# Patient Record
Sex: Male | Born: 1960 | Hispanic: No | Marital: Married | Smoking: Never smoker
Health system: Southern US, Community
[De-identification: ages and names within clinical notes are randomized; demographics above are authoritative.]

## PROBLEM LIST (undated history)

## (undated) DIAGNOSIS — I639 Cerebral infarction, unspecified: Secondary | ICD-10-CM

## (undated) DIAGNOSIS — I10 Essential (primary) hypertension: Secondary | ICD-10-CM

## (undated) HISTORY — DX: Cerebral infarction, unspecified: I63.9

## (undated) HISTORY — DX: Essential (primary) hypertension: I10

---

## 2015-07-19 ENCOUNTER — Encounter (HOSPITAL_COMMUNITY): Payer: Self-pay | Admitting: Family Medicine

## 2015-07-19 ENCOUNTER — Inpatient Hospital Stay (HOSPITAL_COMMUNITY): Payer: 59

## 2015-07-19 ENCOUNTER — Emergency Department (HOSPITAL_COMMUNITY): Payer: 59

## 2015-07-19 ENCOUNTER — Inpatient Hospital Stay (HOSPITAL_COMMUNITY)
Admission: EM | Admit: 2015-07-19 | Discharge: 2015-07-22 | DRG: 065 | Disposition: A | Discharge status: Rehabilitation Facility | Payer: 59 | Attending: Neurology | Admitting: Neurology

## 2015-07-19 DIAGNOSIS — K5900 Constipation, unspecified: Secondary | ICD-10-CM | POA: Diagnosis present

## 2015-07-19 DIAGNOSIS — I10 Essential (primary) hypertension: Secondary | ICD-10-CM | POA: Diagnosis present

## 2015-07-19 DIAGNOSIS — I69398 Other sequelae of cerebral infarction: Secondary | ICD-10-CM | POA: Diagnosis not present

## 2015-07-19 DIAGNOSIS — I161 Hypertensive emergency: Secondary | ICD-10-CM | POA: Diagnosis present

## 2015-07-19 DIAGNOSIS — R531 Weakness: Secondary | ICD-10-CM | POA: Diagnosis not present

## 2015-07-19 DIAGNOSIS — N179 Acute kidney failure, unspecified: Secondary | ICD-10-CM | POA: Diagnosis present

## 2015-07-19 DIAGNOSIS — D751 Secondary polycythemia: Secondary | ICD-10-CM | POA: Diagnosis present

## 2015-07-19 DIAGNOSIS — E785 Hyperlipidemia, unspecified: Secondary | ICD-10-CM | POA: Diagnosis present

## 2015-07-19 DIAGNOSIS — I69393 Ataxia following cerebral infarction: Secondary | ICD-10-CM | POA: Diagnosis not present

## 2015-07-19 DIAGNOSIS — R221 Localized swelling, mass and lump, neck: Secondary | ICD-10-CM | POA: Diagnosis not present

## 2015-07-19 DIAGNOSIS — I6622 Occlusion and stenosis of left posterior cerebral artery: Secondary | ICD-10-CM | POA: Diagnosis present

## 2015-07-19 DIAGNOSIS — E876 Hypokalemia: Secondary | ICD-10-CM | POA: Diagnosis present

## 2015-07-19 DIAGNOSIS — R413 Other amnesia: Secondary | ICD-10-CM | POA: Diagnosis present

## 2015-07-19 DIAGNOSIS — I618 Other nontraumatic intracerebral hemorrhage: Secondary | ICD-10-CM | POA: Diagnosis not present

## 2015-07-19 DIAGNOSIS — G4733 Obstructive sleep apnea (adult) (pediatric): Secondary | ICD-10-CM | POA: Diagnosis present

## 2015-07-19 DIAGNOSIS — Z8249 Family history of ischemic heart disease and other diseases of the circulatory system: Secondary | ICD-10-CM | POA: Diagnosis not present

## 2015-07-19 DIAGNOSIS — E669 Obesity, unspecified: Secondary | ICD-10-CM | POA: Diagnosis present

## 2015-07-19 DIAGNOSIS — Z6838 Body mass index (BMI) 38.0-38.9, adult: Secondary | ICD-10-CM | POA: Diagnosis not present

## 2015-07-19 DIAGNOSIS — R29700 NIHSS score 0: Secondary | ICD-10-CM | POA: Diagnosis present

## 2015-07-19 DIAGNOSIS — I619 Nontraumatic intracerebral hemorrhage, unspecified: Secondary | ICD-10-CM | POA: Diagnosis present

## 2015-07-19 DIAGNOSIS — I6501 Occlusion and stenosis of right vertebral artery: Secondary | ICD-10-CM | POA: Diagnosis present

## 2015-07-19 DIAGNOSIS — I6789 Other cerebrovascular disease: Secondary | ICD-10-CM | POA: Diagnosis not present

## 2015-07-19 DIAGNOSIS — I610 Nontraumatic intracerebral hemorrhage in hemisphere, subcortical: Secondary | ICD-10-CM

## 2015-07-19 DIAGNOSIS — G8191 Hemiplegia, unspecified affecting right dominant side: Secondary | ICD-10-CM | POA: Diagnosis present

## 2015-07-19 DIAGNOSIS — F411 Generalized anxiety disorder: Secondary | ICD-10-CM | POA: Diagnosis not present

## 2015-07-19 DIAGNOSIS — E8809 Other disorders of plasma-protein metabolism, not elsewhere classified: Secondary | ICD-10-CM | POA: Diagnosis not present

## 2015-07-19 DIAGNOSIS — R269 Unspecified abnormalities of gait and mobility: Secondary | ICD-10-CM

## 2015-07-19 DIAGNOSIS — I7100 Dissection of unspecified site of aorta: Secondary | ICD-10-CM

## 2015-07-19 LAB — I-STAT CHEM 8, ED
BUN: 18 mg/dL (ref 6–20)
CALCIUM ION: 1.08 mmol/L — AB (ref 1.12–1.23)
CHLORIDE: 102 mmol/L (ref 101–111)
CREATININE: 1.2 mg/dL (ref 0.61–1.24)
GLUCOSE: 98 mg/dL (ref 65–99)
HCT: 53 % — ABNORMAL HIGH (ref 39.0–52.0)
HEMOGLOBIN: 18 g/dL — AB (ref 13.0–17.0)
Potassium: 3.3 mmol/L — ABNORMAL LOW (ref 3.5–5.1)
Sodium: 141 mmol/L (ref 135–145)
TCO2: 28 mmol/L (ref 0–100)

## 2015-07-19 LAB — COMPREHENSIVE METABOLIC PANEL
ALT: 23 U/L (ref 17–63)
ANION GAP: 12 (ref 5–15)
AST: 29 U/L (ref 15–41)
Albumin: 3.8 g/dL (ref 3.5–5.0)
Alkaline Phosphatase: 134 U/L — ABNORMAL HIGH (ref 38–126)
BUN: 13 mg/dL (ref 6–20)
CALCIUM: 9.5 mg/dL (ref 8.9–10.3)
CO2: 25 mmol/L (ref 22–32)
Chloride: 103 mmol/L (ref 101–111)
Creatinine, Ser: 1.34 mg/dL — ABNORMAL HIGH (ref 0.61–1.24)
GFR, EST NON AFRICAN AMERICAN: 58 mL/min — AB (ref >60)
Glucose, Bld: 103 mg/dL — ABNORMAL HIGH (ref 65–99)
Potassium: 3.3 mmol/L — ABNORMAL LOW (ref 3.5–5.1)
SODIUM: 140 mmol/L (ref 135–145)
TOTAL PROTEIN: 9 g/dL — AB (ref 6.5–8.1)
Total Bilirubin: 0.9 mg/dL (ref 0.3–1.2)

## 2015-07-19 LAB — DIFFERENTIAL
BASOS PCT: 1 %
Basophils Absolute: 0.1 10*3/uL (ref 0.0–0.1)
EOS ABS: 0.1 10*3/uL (ref 0.0–0.7)
EOS PCT: 0 %
Lymphocytes Relative: 15 %
Lymphs Abs: 1.7 10*3/uL (ref 0.7–4.0)
MONO ABS: 0.7 10*3/uL (ref 0.1–1.0)
Monocytes Relative: 6 %
NEUTROS ABS: 8.9 10*3/uL — AB (ref 1.7–7.7)
Neutrophils Relative %: 78 %

## 2015-07-19 LAB — PROTIME-INR
INR: 1.04 (ref 0.00–1.49)
PROTHROMBIN TIME: 13.8 s (ref 11.6–15.2)

## 2015-07-19 LAB — CBC
HCT: 48 % (ref 39.0–52.0)
Hemoglobin: 17.2 g/dL — ABNORMAL HIGH (ref 13.0–17.0)
MCH: 28.4 pg (ref 26.0–34.0)
MCHC: 35.8 g/dL (ref 30.0–36.0)
MCV: 79.3 fL (ref 78.0–100.0)
PLATELETS: 198 10*3/uL (ref 150–400)
RBC: 6.05 MIL/uL — ABNORMAL HIGH (ref 4.22–5.81)
RDW: 13.4 % (ref 11.5–15.5)
WBC: 11.5 10*3/uL — AB (ref 4.0–10.5)

## 2015-07-19 LAB — LIPID PANEL
Cholesterol: 193 mg/dL (ref 0–200)
HDL: 36 mg/dL — ABNORMAL LOW (ref >40)
LDL CALC: 136 mg/dL — AB (ref 0–99)
TRIGLYCERIDES: 104 mg/dL (ref <150)
Total CHOL/HDL Ratio: 5.4 RATIO
VLDL: 21 mg/dL (ref 0–40)

## 2015-07-19 LAB — APTT: aPTT: 32 seconds (ref 24–37)

## 2015-07-19 LAB — I-STAT TROPONIN, ED: Troponin i, poc: 0.05 ng/mL (ref 0.00–0.08)

## 2015-07-19 LAB — CBG MONITORING, ED: GLUCOSE-CAPILLARY: 86 mg/dL (ref 65–99)

## 2015-07-19 LAB — MRSA PCR SCREENING: MRSA BY PCR: NEGATIVE

## 2015-07-19 MED ORDER — CLEVIDIPINE BUTYRATE 0.5 MG/ML IV EMUL
0.0000 mg/h | INTRAVENOUS | Status: DC
  Administered 2015-07-19: 1 mg/h via INTRAVENOUS
  Administered 2015-07-19: 21 mg/h via INTRAVENOUS
  Administered 2015-07-19: 19 mg/h via INTRAVENOUS
  Filled 2015-07-19 (×6): qty 50

## 2015-07-19 MED ORDER — FUROSEMIDE 10 MG/ML IJ SOLN
40.0000 mg | Freq: Once | INTRAMUSCULAR | Status: AC
  Administered 2015-07-19: 40 mg via INTRAVENOUS
  Filled 2015-07-19: qty 4

## 2015-07-19 MED ORDER — LABETALOL HCL 5 MG/ML IV SOLN
0.5000 mg/min | INTRAVENOUS | Status: DC
  Administered 2015-07-20: 0.5 mg/min via INTRAVENOUS
  Filled 2015-07-19: qty 100

## 2015-07-19 MED ORDER — IOPAMIDOL (ISOVUE-370) INJECTION 76%
INTRAVENOUS | Status: AC
  Administered 2015-07-19: 75 mL via INTRAVENOUS
  Filled 2015-07-19: qty 50

## 2015-07-19 MED ORDER — NICARDIPINE HCL IN NACL 20-0.86 MG/200ML-% IV SOLN
INTRAVENOUS | Status: AC
  Filled 2015-07-19: qty 200

## 2015-07-19 MED ORDER — SENNOSIDES-DOCUSATE SODIUM 8.6-50 MG PO TABS
1.0000 | ORAL_TABLET | Freq: Two times a day (BID) | ORAL | Status: DC
  Administered 2015-07-19 – 2015-07-22 (×6): 1 via ORAL
  Filled 2015-07-19 (×8): qty 1

## 2015-07-19 MED ORDER — LORAZEPAM 2 MG/ML IJ SOLN: 1.0000 mg | Freq: Four times a day (QID) | INTRAMUSCULAR | Status: DC | PRN

## 2015-07-19 MED ORDER — LABETALOL HCL 5 MG/ML IV SOLN
10.0000 mg | INTRAVENOUS | Status: DC | PRN
Start: 2015-07-19 — End: 2015-07-22
  Administered 2015-07-21: 10 mg via INTRAVENOUS
  Filled 2015-07-19 (×2): qty 4

## 2015-07-19 MED ORDER — ACETAMINOPHEN 325 MG PO TABS: 650.0000 mg | ORAL_TABLET | ORAL | Status: DC | PRN

## 2015-07-19 MED ORDER — PANTOPRAZOLE SODIUM 40 MG IV SOLR
40.0000 mg | Freq: Every day | INTRAVENOUS | Status: DC
  Administered 2015-07-19: 40 mg via INTRAVENOUS
  Filled 2015-07-19: qty 40

## 2015-07-19 MED ORDER — STROKE: EARLY STAGES OF RECOVERY BOOK
Freq: Once | Status: DC
  Filled 2015-07-19: qty 1

## 2015-07-19 MED ORDER — LORAZEPAM 2 MG/ML IJ SOLN
1.0000 mg | Freq: Once | INTRAMUSCULAR | Status: AC
  Administered 2015-07-19: 1 mg via INTRAVENOUS

## 2015-07-19 MED ORDER — NICARDIPINE HCL IN NACL 20-0.86 MG/200ML-% IV SOLN
3.0000 mg/h | INTRAVENOUS | Status: DC
  Administered 2015-07-19 (×2): 15 mg/h via INTRAVENOUS
  Administered 2015-07-20: 7.5 mg/h via INTRAVENOUS
  Administered 2015-07-20: 7 mg/h via INTRAVENOUS
  Administered 2015-07-20: 7.5 mg/h via INTRAVENOUS
  Administered 2015-07-20: 15 mg/h via INTRAVENOUS
  Administered 2015-07-20: 5 mg/h via INTRAVENOUS
  Filled 2015-07-19 (×3): qty 200
  Filled 2015-07-19: qty 400
  Filled 2015-07-19 (×2): qty 200

## 2015-07-19 MED ORDER — LORAZEPAM 2 MG/ML IJ SOLN
INTRAMUSCULAR | Status: AC
  Filled 2015-07-19: qty 1

## 2015-07-19 MED ORDER — ACETAMINOPHEN 650 MG RE SUPP: 650.0000 mg | RECTAL | Status: DC | PRN

## 2015-07-19 MED ORDER — SODIUM CHLORIDE 0.9 % IV SOLN: INTRAVENOUS | Status: DC | PRN

## 2015-07-19 MED ORDER — LORAZEPAM 2 MG/ML IJ SOLN
1.0000 mg | Freq: Four times a day (QID) | INTRAMUSCULAR | Status: DC | PRN
  Filled 2015-07-19: qty 1

## 2015-07-19 MED ORDER — LORAZEPAM 2 MG/ML IJ SOLN
1.0000 mg | Freq: Four times a day (QID) | INTRAMUSCULAR | Status: DC
  Administered 2015-07-19: 1 mg via INTRAVENOUS

## 2015-07-19 MED ORDER — IOPAMIDOL (ISOVUE-370) INJECTION 76%
INTRAVENOUS | Status: AC
  Filled 2015-07-19: qty 50

## 2015-07-19 MED ORDER — LABETALOL HCL 5 MG/ML IV SOLN
20.0000 mg | Freq: Once | INTRAVENOUS | Status: AC
  Administered 2015-07-19: 20 mg via INTRAVENOUS
  Filled 2015-07-19: qty 4

## 2015-07-19 MED ORDER — LORAZEPAM 2 MG/ML IJ SOLN
0.2500 mg | Freq: Once | INTRAMUSCULAR | Status: AC
  Administered 2015-07-19: 0.25 mg via INTRAVENOUS
  Filled 2015-07-19: qty 1

## 2015-07-19 NOTE — ED Provider Notes (Signed)
CSN: [id number]     Arrival date & time 07/19/15  62 History   First MD Initiated Contact with Patient 07/19/15 1335     Chief Complaint  Patient presents with  . Weakness  . Gait Problem  . Dizziness    @EDPCLEARED @ (Consider location/radiation/quality/duration/timing/severity/associated sxs/prior Treatment) HPI Patient states symptoms started at approximately 11 AM. 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 He reports at first it seemed like both legs were the problem but then it concentrated into the right lower extremity. 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 He reports he made no some weakness in the right arm to but not pronounced. He reports that this time the symptoms feel better but he still feels a little weak on the right. History reviewed. No pertinent past medical history. History reviewed. No pertinent past surgical history. History reviewed. No pertinent family history. Social History  Substance Use Topics  . Smoking status: Never Smoker   . Smokeless tobacco: None  . Alcohol Use: None    Review of Systems  10 Systems reviewed and are negative for acute change except as noted in the HPI.   Allergies  Review of patient's allergies indicates no known allergies.  Home Medications   Prior to Admission medications   Not on File   BP 127/92 mmHg  Pulse 117  Temp(Src) 98.1 F (36.7 C) (Oral)  Resp 18  Wt 269 lb 8 oz (122.244 kg)  SpO2 98% Physical Exam  Constitutional: He is oriented to person, place, and time. He appears well-developed and well-nourished.  patient is alert and nontoxic. No respiratory distress.  HENT:  Head: Normocephalic and atraumatic.  Eyes: EOM are normal.  Neck: Neck supple.  Cardiovascular:  Regular rhythm, normal heart sounds and intact distal pulses.   No murmur heard. Tachycardic.  Pulmonary/Chest: Effort normal and breath sounds normal.  Abdominal: Soft. Bowel sounds are normal. He exhibits no distension. There is no tenderness.  Musculoskeletal: Normal range of motion. He exhibits no edema or tenderness.  Neurological: He is alert and oriented to person, place, and time. He has normal strength. No cranial nerve deficit. He exhibits normal muscle tone. Coordination normal. GCS eye subscore is 4. GCS verbal subscore is 5. GCS motor subscore is 6.  Skin: Skin is warm, dry and intact.  Psychiatric: He has a normal mood and affect.    ED Course  Procedures (including critical care time) Labs Review Labs Reviewed  CBC - Abnormal; Notable for the following:    WBC 11.5 (*)    RBC 6.05 (*)    Hemoglobin 17.2 (*)    All other components within normal limits  DIFFERENTIAL - Abnormal; Notable for the following:    Neutro Abs 8.9 (*)    All other components within normal limits  COMPREHENSIVE METABOLIC PANEL - Abnormal; Notable for the following:    Potassium 3.3 (*)    Glucose, Bld 103 (*)    Creatinine, Ser 1.34 (*)    Total Protein 9.0 (*)    Alkaline Phosphatase 134 (*)    GFR calc non Af Amer 58 (*)    All other components within normal limits  I-STAT CHEM 8, ED - Abnormal; Notable for the  following:    Potassium 3.3 (*)    Calcium, Ion 1.08 (*)    Hemoglobin 18.0 (*)    HCT 53.0 (*)    All other components within normal limits  PROTIME-INR  APTT  HEMOGLOBIN A1C  LIPID PANEL  I-STAT TROPOININ, ED  CBG MONITORING, ED    Imaging Review Ct Head Wo Contrast  07/19/2015  CLINICAL DATA:  Right side weakness and dizziness. Initial encounter. EXAM: CT HEAD WITHOUT CONTRAST TECHNIQUE: Contiguous axial images were obtained from the base of the skull through the vertex without intravenous contrast. COMPARISON:  None. FINDINGS: The patient has an acute hemorrhage in the  left thalamus measuring approximately 2.0 x 1.8 cm in the axial plane x 2 cm craniocaudal for total volume of 3.6 cc. Also seen is some chronic microvascular ischemic change. No mass lesion, midline shift, hydrocephalus, subarachnoid or subdural hemorrhage is identified. Mucosal thickening in the maxillary sinuses is worse on the left. The calvarium is intact. IMPRESSION: Acute left thalamic hemorrhage as described above. Volume is estimated at 3.6 cc. These results were called by telephone at the time of interpretation on 07/19/2015 at 2:00 pm to Dr. Silverio Decamp, who verbally acknowledged these results. Electronically Signed   By: Inge Rise M.D.   On: 07/19/2015 14:01   I have personally reviewed and evaluated these images and lab results as part of my medical decision-making.   EKG Interpretation   Date/Time:  Sunday July 19 2015 13:19:22 EDT Ventricular Rate:  111 PR Interval:  152 QRS Duration: 104 QT Interval:  368 QTC Calculation: 500 R Axis:   46 Text Interpretation:  Sinus tachycardia ST \\T \ T wave abnormality,  consider inferolateral ischemia Abnormal ECG No old tracing to compare  Confirmed by GOLDSTON  MD, SCOTT ([id number]) on 07/19/2015 1:20:07 PM     Consult: A Nandigam was consulted for code stroke. He identified the patient as having intracerebral hemorrhage with hypertension and will admit the patient to the neuro ICU. MDM   Final diagnoses:  ICH (intracerebral hemorrhage) ([place])  ICH (intracerebral hemorrhage) ([place])   Patient presents alert without acute distress. History was for sudden onset of dizziness and right-sided weakness.  His mental status clear without cognitive or speech deficit and no gross motor deficit. CT has identified intracerebral hemorrhage and per Dr. Silverio Decamp, patient has significant hypertension and is initiating blood pressure management with plan for ICU admission.    Charlesetta Shanks, MD 07/19/15 1430

## 2015-07-19 NOTE — Consult Note (Signed)
Called to eval because of blood pressure >160. I am going to change clevidipine to nicardipine for goal of 0000000 systolic. Ativan 1mg  now - pt specifically telling me he feels anxious and restless which might be contributing to HTN.  Will also aorder 1mg  q6h IV for anxiery and restlessness.  Stroke team to see pt in am and to address HTN and other medical issues

## 2015-07-19 NOTE — Procedures (Signed)
Arterial Catheter Insertion Procedure Note Elazar Bethke [id number] 06/16/1960  Procedure: Insertion of Arterial Catheter  Indications: Blood pressure monitoring  Procedure Details Consent: Risks of procedure as well as the alternatives and risks of each were explained to the (patient/caregiver).  Consent for procedure obtained. Time Out: Verified patient identification, verified procedure, site/side was marked, verified correct patient position, special equipment/implants available, medications/allergies/relevent history reviewed, required imaging and test results available.  Performed  Maximum sterile technique was used including antiseptics, cap, gloves and gown. Skin prep: Chlorhexidine; local anesthetic administered 20 gauge catheter was inserted into left radial artery using the Seldinger technique.  Evaluation Blood flow good; BP tracing good. Complications: No apparent complications.   Janett Labella 07/19/2015

## 2015-07-19 NOTE — Progress Notes (Signed)
eLink Physician-Brief Progress Note Patient Name: Glenn Liu DOB: [birth year] MRN: [id number]   Date of Service  07/19/2015  HPI/Events of Note  Hypertension - BP = 169/69. Patient on Maximal dose of Cleviprex  IV infusion.  eICU Interventions  Will start Labetolol IV infusion. Titrate for SBP < 160.     Intervention Category Major Interventions: Hypertension - evaluation and management  Katasha Riga Eugene 07/19/2015, 5:30 PM

## 2015-07-19 NOTE — H&P (Signed)
Requesting Physician: Dr.   Johnney Killian    Reason for consultation:  Stroke code  HPI:                                                                                                                                         Glenn Liu is an 55 y.o. male patient who  was brought into the emergency room from church with transient symptoms of altered mental status and right-sided weakness. 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 All these symptoms lasted around 15-20 minutes per history from patient. When he was brought into the triage, his blood pressures reportedly in 120s the triage recording. Initial CT of the head showed a left thalamic intracerebral hemorrhage with no significant mass effect or intraventricular extension.  His blood pressure in ER was extremely high at systolics 123456.  At this time patient denies any focal sensorimotor symptoms or vision speech problems no incoordination. He feels he is at his baseline now.   Date last known well:  2017, April 23rd Time last known well:  At  11 am tPA Given: No: ICH  Stroke Risk Factors - hypertension  Past Medical History: History reviewed. No pertinent past medical history.  History reviewed. No pertinent past surgical history.  Family History: History reviewed. No pertinent family history.  Social History:   reports that he has never smoked. He does not have any smokeless tobacco history on file. His alcohol and drug histories are not on file.  Allergies:  No Known Allergies   Medications:                                                                                                                         Current facility-administered medications:  .   stroke: mapping our early stages of recovery book, , Does not apply, Once,  Qaadir Kent Fuller Mandril, MD .  acetaminophen (TYLENOL) tablet 650 mg, 650 mg, Oral, Q4H PRN **OR** acetaminophen (TYLENOL) suppository 650 mg, 650 mg, Rectal, Q4H PRN, Allysia Ingles Fuller Mandril, MD .  clevidipine (CLEVIPREX) infusion 0.5 mg/mL, 0-21 mg/hr, Intravenous, Continuous, Taitum Menton Fuller Mandril, MD .  labetalol (NORMODYNE,TRANDATE) injection 10-40 mg, 10-40  mg, Intravenous, Q10 min PRN, Armonie Mettler Fuller Mandril, MD .  labetalol (NORMODYNE,TRANDATE) injection 20 mg, 20 mg, Intravenous, Once, Landynn Dupler Yahoo, MD .  pantoprazole (PROTONIX) injection 40 mg, 40 mg, Intravenous, QHS, Adyn Serna Fuller Mandril, MD .  senna-docusate (Senokot-S) tablet 1 tablet, 1 tablet, Oral, BID, Cali Hope Fuller Mandril, MD No current outpatient prescriptions on file.   ROS:                                                                                                                                       History obtained from the patient  General ROS: negative for - chills, fatigue, fever, night sweats, weight gain or weight loss Psychological ROS: negative for - behavioral disorder, hallucinations, memory difficulties, mood swings or suicidal ideation Ophthalmic ROS: negative for - blurry vision, double vision, eye pain or loss of vision ENT ROS: negative for - epistaxis, nasal discharge, oral lesions, sore throat, tinnitus or vertigo Allergy and Immunology ROS: negative for - hives or itchy/watery eyes Hematological and Lymphatic ROS: negative for - bleeding problems, bruising or swollen lymph nodes Endocrine ROS: negative for - galactorrhea, hair pattern changes, polydipsia/polyuria or temperature intolerance Respiratory ROS: negative for - cough, hemoptysis, shortness of breath or wheezing Cardiovascular ROS: negative for - chest pain, dyspnea on exertion, edema or irregular heartbeat Gastrointestinal ROS: negative for - abdominal pain, diarrhea, hematemesis,  nausea/vomiting or stool incontinence Genito-Urinary ROS: negative for - dysuria, hematuria, incontinence or urinary frequency/urgency Musculoskeletal ROS: negative for - joint swelling or muscular weakness Neurological ROS: as noted in HPI Dermatological ROS: negative for rash and skin lesion changes  Neurologic Examination:                                                                                                    Today's Vitals   07/19/15 1327  BP: 127/92  Pulse: 117  Temp: 98.1 F (36.7 C)  TempSrc: Oral  Resp: 18  Weight: 122.244 kg (269 lb 8 oz)  SpO2: 98%    Evaluation of higher integrative functions including: Level of alertness: Alert,  Oriented to time, place and person Recent and remote memory - recall 1/ 3 items Attention span and concentration  - intact   Speech: fluent, no evidence of dysarthria or aphasia noted.  Test the following cranial nerves: 2-12 grossly intact, no visual field defects, no gaze preference or deviation Motor examination: Normal tone, bulk, full 5/5 motor strength in all 4 extremities Examination of sensation :  Normal and symmetric sensation to pinprick in all 4 extremities and on face Examination of deep tendon reflexes: 2+, normal and symmetric in all extremities, normal plantars bilaterally Test coordination: Normal finger nose testing, with no evidence of limb appendicular ataxia or abnormal involuntary movements or tremors noted.  Gait: Deferred   Lab Results: Basic Metabolic Panel: No results for input(s): NA, K, CL, CO2, GLUCOSE, BUN, CREATININE, CALCIUM, MG, PHOS in the last 168 hours.  Liver Function Tests: No results for input(s): AST, ALT, ALKPHOS, BILITOT, PROT, ALBUMIN in the last 168 hours. No results for input(s): LIPASE, AMYLASE in the last 168 hours. No results for input(s): AMMONIA in the last 168 hours.  CBC:  Recent Labs Lab 07/19/15 1321  WBC 11.5*  NEUTROABS 8.9*  HGB 17.2*  HCT 48.0  MCV 79.3  PLT  198    Cardiac Enzymes: No results for input(s): CKTOTAL, CKMB, CKMBINDEX, TROPONINI in the last 168 hours.  Lipid Panel: No results for input(s): CHOL, TRIG, HDL, CHOLHDL, VLDL, LDLCALC in the last 168 hours.  CBG:  Recent Labs Lab 07/19/15 [id number]    Microbiology: No results found for this or any previous visit.   Imaging: CT head    Stroke Scales   NIHSS :  0  ICH Score:  0  ICH VOL 6.5 CC, no IVH.           Pre stroke Modified Rankin Scale (mRS): 0     Assessment and plan:   Glenn Liu is an 55 y.o. 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  CT of the head showed a left thalamic intracerebral hemorrhage, with an approximate volume of 6.5 mL using ABC by 2 method, with no significant mass effect or intraventricular extension. ICH score is 0. NIH stroke scale is also 0. Patient does not take any prescription medicines at home,  no antiplatelet or anticoagulant medication use. He has not seen a physician in 30 years and has not checked his blood pressure recently. He is a Environmental education officer by profession.  The only positive findings noted on neurological exam are reduced memory recall of one out of 3 items at 5 minutes. Surprisingly no sensory deficits despite the left thalamic hemorrhage, no visual field deficit, no motor weakness or appendicular ataxia.   His blood pressure was extremely high at systolic of 123456 in the ER. Initial dose of labetalol 20 mg stat was given followed by clevidipine infusion, titrated to a goal blood pressure of systolic 0000000.  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  He will be admitted to neuro ICU for further management of his blood pressure and neurological monitoring.  His wife is at bedside, discussed the neurological assessment and treatment plan with patient and his wife, answered several of the questions to her satisfaction.  Neurology stroke team will continue to follow starting tomorrow morning.

## 2015-07-19 NOTE — Consult Note (Signed)
PULMONARY / CRITICAL CARE MEDICINE   Name: Philippe Poston MRN: [id number] DOB: December 29, 1960    ADMISSION DATE:  07/19/2015 CONSULTATION DATE:  07/19/2015  REFERRING MD:  Dr. 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  Symptoms resolved within minutes. Subsequently dx with ICH.  PCCM consulted for assistance with BP management.  PAST MEDICAL HISTORY :  Patient denies known medical problems but has not been to the doctor in 40 years.  PAST SURGICAL HISTORY: He  has no past surgical history on file.  No Known Allergies  No current facility-administered medications on file prior to encounter.   No current outpatient prescriptions on file prior to encounter.    FAMILY HISTORY:  Mother - MI No known family hx of ICH, SAH, aneurysms.  SOCIAL HISTORY: He  reports that he has never smoked. He does not have any smokeless tobacco history on file.  REVIEW OF SYSTEMS:   General:  Denies change in appetite Cardiopulm: denies dyspnea or chest pain GI:  Denies change in bowel habits GU:  Denies change in urinary habits Neuro:  Per HPI  SUBJECTIVE:  Symptoms have completely resolved.  No complaints.  Visiting with family at bedside.  VITAL SIGNS: BP 131/70 mmHg  Pulse 90  Temp(Src) 98.1 F (36.7 C) (Oral)  Resp 13  Ht 5\' 11"  (1.803 m)  Wt 257 lb 4.4 oz (116.7 kg)  BMI 35.90 kg/m2  SpO2 98%  HEMODYNAMICS:  stable  VENTILATOR SETTINGS:  N/A  INTAKE / OUTPUT: I/O last 3 completed shifts: In: 81.3 [I.V.:81.3] Out: 600 [Urine:600]  PHYSICAL EXAMINATION: General:  Well developed male in NAD Neuro:  AAO x4, responding appropriately, no dysarthria, PERRL, EOMI, 5/5 MMS UE/LE, sensation grossly intact, MAE spontaneously, CN 2-12 grossly intact HEENT:  [place]/AT Cardiovascular:  RRR, + 2/6 systolic murmur Lungs:  CTA  B/L Abdomen:  + BS, soft, NT, ND Musculoskeletal:  Normal tone Skin:  Intact, no rashes  LABS:  BMET  Recent Labs Lab 07/19/15 1321 07/19/15 1355  NA 140 141  K 3.3* 3.3*  CL 103 102  CO2 25  --   BUN 13 18  CREATININE 1.34* 1.20  GLUCOSE 103* 98    Electrolytes  Recent Labs Lab 07/19/15 1321  CALCIUM 9.5    CBC  Recent Labs Lab 07/19/15 1321 07/19/15 1355  WBC 11.5*  --   HGB 17.2* 18.0*  HCT 48.0 53.0*  PLT 198  --     Coag's  Recent Labs Lab 07/19/15 1321  APTT 32  INR 1.04   Liver Enzymes  Recent Labs Lab 07/19/15 1321  AST 29  ALT 23  ALKPHOS 134*  BILITOT 0.9  ALBUMIN 3.8    Glucose  Recent Labs Lab 07/19/15 1337  GLUCAP 86    Imaging Ct Angio Head W/cm &/or Wo Cm  07/19/2015  CLINICAL DATA:  55 year old male with acute right side weakness and dizziness found have an acute left thalamic hemorrhage on noncontrast head CT. Initial encounter. EXAM: CT ANGIOGRAPHY HEAD AND NECK TECHNIQUE: Multidetector CT imaging of the head and neck was performed using the standard protocol during bolus administration of intravenous contrast. Multiplanar CT image reconstructions and MIPs were obtained to evaluate the vascular anatomy. Carotid stenosis measurements (when applicable) are obtained utilizing NASCET criteria, using the distal internal carotid diameter as the  denominator. CONTRAST:  75 mL Isovue 370 COMPARISON:  Noncontrast head CT 1347 hours today. FINDINGS: CTA NECK Skeleton: Occasional poor dentition. Lower cervical spine chronic disc and endplate degeneration. No acute osseous abnormality identified. Left maxillary sinus mucosal thickening. Other neck: Negative lung apices. No superior mediastinal lymphadenopathy. Negative thyroid, larynx, pharynx, parapharyngeal spaces, retropharyngeal space and sublingual space. No pharyngeal mass identified. Submandibular glands and parotid glands are within normal limits. Visualized orbit soft tissues are  within normal limits. Left side cervical lymph nodes are within normal limits; there is a prominent left level IIa node as seen on sagittal image 179, but this has a maintained normal fatty hilum. There is a large cystic right neck mass situated at the level 2 nodal station between the right carotid space and right sternocleidomastoid muscle. This has simple fluid densitometry (10-15 Hounsfield units) and encompasses 57 x 32 x 42 mm (AP by transverse by CC). No surrounding inflammatory stranding. There does appear to be mild thickening of the wall of the lesion in some areas (series 501, image 87). Otherwise no right side lymphadenopathy. Aortic arch: 3 vessel arch configuration. No arch atherosclerosis. No great vessel origin stenosis. Right carotid system: Mildly tortuous proximal right CCA. Soft plaque along the anterior wall of the right CCA at the larynx without stenosis. Circumferential soft plaque at the right carotid bifurcation with less than 50 % stenosis with respect to the distal vessel. Left carotid system: Negative proximal left CCA. Mild soft plaque in the medial left CCA at the level of the larynx, no stenosis. Circumferential soft plaque at the left carotid bifurcation. No stenosis. Mildly tortuous cervical left ICA. Vertebral arteries: Tortuous proximal right subclavian artery without stenosis. Soft plaque at the right vertebral artery origin resulting in moderate to severe stenosis (series 501, image 35). Otherwise negative right vertebral artery to the skullbase. No proximal left subclavian artery stenosis. Normal left vertebral artery origin. Tortuous left V1 segment. The left vertebral artery is mildly dominant and normal to the skullbase. CTA HEAD Posterior circulation: Mildly dominant distal left vertebral artery. Normal PICA vessels and vertebrobasilar junction. No distal vertebral stenosis. No basilar artery abnormality. Normal SCA and right PCA origins. There is mild irregularity and  stenosis at the left PCA origin (series 503, image 109). Left posterior communicating artery is present. The right is diminutive or absent. Bilateral PCA branches are within normal limits. No CTA spot sign at the left thalamus region intra-axial hemorrhage. No evidence of associated vascular malformation. Anterior circulation: Both ICA siphons are patent with mild calcified plaque and no stenosis. Normal ophthalmic and left posterior communicating artery origins. Normal carotid termini. Normal MCA and ACA origins. The anterior communicating artery is ectatic, but without discrete aneurysm. Bilateral ACA branches are normal aside from mild ectasia/tortuosity. Right MCA M1 segment, bifurcation, and most right MCA branches are within normal limits. There is mild to moderate irregularity and stenosis in the proximal aspect of the dominant left posterior sylvian division (series 506, image 16). Left MCA M1 segment, bifurcation, and most left MCA branches are within normal limits. There is mild irregularity and stenosis in the posterior left MCA M2/M3 branches (series 506, image 32). Venous sinuses: Patent. Anatomic variants: Mildly dominant left vertebral artery and mildly fetal type left PCA origin. IMPRESSION: 1. Negative for CTA spot sign or evidence of vascular malformation associated with the left thalamic region hemorrhage. 2. 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 No anterior circulation large vessel stenosis despite plaque, but there is  bilateral MCA M2/M3 mild to moderate irregularity and stenosis. 3. Posterior circulation remarkable for moderate to severe stenosis at the right vertebral artery origin, and mild stenosis at the left PCA origin. 4. There is a large cystic right neck mass measuring 5.7 cm. 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 ENT follow-up is necessary. Study discussed by telephone with Dr. Audria Nine on 07/19/2015 at 16:44 . Electronically Signed   By: Genevie Ann M.D.   On: 07/19/2015 16:47   Ct Head Wo Contrast  07/19/2015  CLINICAL DATA:  Right side weakness and dizziness. Initial encounter. EXAM: CT HEAD WITHOUT CONTRAST TECHNIQUE: Contiguous axial images were obtained from the base of the skull through the vertex without intravenous contrast. COMPARISON:  None. FINDINGS: The patient has an acute hemorrhage in the left thalamus measuring approximately 2.0 x 1.8 cm in the axial plane x 2 cm craniocaudal for total volume of 3.6 cc. Also seen is some chronic microvascular ischemic change. No mass lesion, midline shift, hydrocephalus, subarachnoid or subdural hemorrhage is identified. Mucosal thickening in the maxillary sinuses is worse on the left. The calvarium is intact. IMPRESSION: Acute left thalamic hemorrhage as described above. Volume is estimated at 3.6 cc. These results were called by telephone at the time of interpretation on 07/19/2015 at 2:00 pm to Dr. Silverio Decamp, who verbally acknowledged these results. Electronically Signed   By: Inge Rise M.D.   On: 07/19/2015 14:01   Ct Angio Neck W/cm &/or Wo/cm  07/19/2015  CLINICAL DATA:  55 year old male with acute right side weakness and dizziness found have an acute left thalamic hemorrhage on noncontrast head CT. Initial encounter. EXAM: CT ANGIOGRAPHY HEAD AND NECK TECHNIQUE: Multidetector CT imaging of the head and neck was performed using the standard protocol during bolus administration of intravenous contrast. Multiplanar CT image reconstructions and MIPs were obtained to evaluate the vascular anatomy. Carotid stenosis measurements (when applicable) are obtained utilizing NASCET criteria, using the distal internal carotid diameter as the denominator. CONTRAST:  75 mL Isovue 370 COMPARISON:  Noncontrast head CT 1347  hours today. FINDINGS: CTA NECK Skeleton: Occasional poor dentition. Lower cervical spine chronic disc and endplate degeneration. No acute osseous abnormality identified. Left maxillary sinus mucosal thickening. Other neck: Negative lung apices. No superior mediastinal lymphadenopathy. Negative thyroid, larynx, pharynx, parapharyngeal spaces, retropharyngeal space and sublingual space. No pharyngeal mass identified. Submandibular glands and parotid glands are within normal limits. Visualized orbit soft tissues are within normal limits. Left side cervical lymph nodes are within normal limits; there is a prominent left level IIa node as seen on sagittal image 179, but this has a maintained normal fatty hilum. There is a large cystic right neck mass situated at the level 2 nodal station between the right carotid space and right sternocleidomastoid muscle. This has simple fluid densitometry (10-15 Hounsfield units) and encompasses 57 x 32 x 42 mm (AP by transverse by CC). No surrounding inflammatory stranding. There does appear to be mild thickening of the wall of the lesion in some areas (series 501, image 87). Otherwise no right side lymphadenopathy. Aortic arch: 3 vessel arch configuration. No arch atherosclerosis. No great vessel origin stenosis. Right carotid system: Mildly tortuous proximal right CCA. Soft plaque along the anterior wall of the right CCA at the larynx without stenosis. Circumferential soft plaque at  the right carotid bifurcation with less than 50 % stenosis with respect to the distal vessel. Left carotid system: Negative proximal left CCA. Mild soft plaque in the medial left CCA at the level of the larynx, no stenosis. Circumferential soft plaque at the left carotid bifurcation. No stenosis. Mildly tortuous cervical left ICA. Vertebral arteries: Tortuous proximal right subclavian artery without stenosis. Soft plaque at the right vertebral artery origin resulting in moderate to severe stenosis  (series 501, image 35). Otherwise negative right vertebral artery to the skullbase. No proximal left subclavian artery stenosis. Normal left vertebral artery origin. Tortuous left V1 segment. The left vertebral artery is mildly dominant and normal to the skullbase. CTA HEAD Posterior circulation: Mildly dominant distal left vertebral artery. Normal PICA vessels and vertebrobasilar junction. No distal vertebral stenosis. No basilar artery abnormality. Normal SCA and right PCA origins. There is mild irregularity and stenosis at the left PCA origin (series 503, image 109). Left posterior communicating artery is present. The right is diminutive or absent. Bilateral PCA branches are within normal limits. No CTA spot sign at the left thalamus region intra-axial hemorrhage. No evidence of associated vascular malformation. Anterior circulation: Both ICA siphons are patent with mild calcified plaque and no stenosis. Normal ophthalmic and left posterior communicating artery origins. Normal carotid termini. Normal MCA and ACA origins. The anterior communicating artery is ectatic, but without discrete aneurysm. Bilateral ACA branches are normal aside from mild ectasia/tortuosity. Right MCA M1 segment, bifurcation, and most right MCA branches are within normal limits. There is mild to moderate irregularity and stenosis in the proximal aspect of the dominant left posterior sylvian division (series 506, image 16). Left MCA M1 segment, bifurcation, and most left MCA branches are within normal limits. There is mild irregularity and stenosis in the posterior left MCA M2/M3 branches (series 506, image 32). Venous sinuses: Patent. Anatomic variants: Mildly dominant left vertebral artery and mildly fetal type left PCA origin. IMPRESSION: 1. Negative for CTA spot sign or evidence of vascular malformation associated with the left thalamic region hemorrhage. 2. 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 No anterior  circulation large vessel stenosis despite plaque, but there is bilateral MCA M2/M3 mild to moderate irregularity and stenosis. 3. Posterior circulation remarkable for moderate to severe stenosis at the right vertebral artery origin, and mild stenosis at the left PCA origin. 4. There is a large cystic right neck mass measuring 5.7 cm. 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 ENT follow-up is necessary. Study discussed by telephone with Dr. Audria Nine on 07/19/2015 at 16:44 . Electronically Signed   By: Genevie Ann M.D.   On: 07/19/2015 16:47     STUDIES:  See imaging above  CULTURES: none  ANTIBIOTICS: none  SIGNIFICANT EVENTS: 04/23:  Admitted for ICH  LINES/TUBES: PIV  ASSESSMENT / PLAN:  ICH: likely 2/2 uncontrolled, undx HTN - Aline BPs elevated despite CCB gtt - continue clevidipine gtt - labetolol gtt added  - IV Lasix 40mg  x 1 - neuro ok with blood pressure goal < 160  - currently neuro intact, protecting airway, continue frequent neuro checks, monitor for any signs of airway compromise  Rest per primary service.  Duwaine Maxin, DO IMTS PGY3 07/19/2015, 5:56 PM

## 2015-07-19 NOTE — Progress Notes (Addendum)
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  LSN 1110, NIHSS 0.  Patient hypertensive at 261/139, hr 103.  20 mg labetalol ordered and clevidipine drip.

## 2015-07-19 NOTE — ED Notes (Signed)
Pt here for right side weakness, dizziness, and gait problem this am. 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 sts started at 11:10. Grip equal, no facial droop.

## 2015-07-19 NOTE — ED Notes (Signed)
After art line placed pt became diaphoretic.  Pressures dropped to 113/76 and pt stated he felt flushed.  Pt neuro intact.  Immediately pressures began to rise to 220 sbp again.  Dr Silverio Decamp notified.

## 2015-07-19 NOTE — Progress Notes (Signed)
Pt still very restless. Notified MD Wendee Beavers. Orders given. See MAR.

## 2015-07-20 ENCOUNTER — Inpatient Hospital Stay (HOSPITAL_COMMUNITY): Payer: 59

## 2015-07-20 ENCOUNTER — Encounter (HOSPITAL_COMMUNITY): Payer: Self-pay | Admitting: Radiology

## 2015-07-20 DIAGNOSIS — E785 Hyperlipidemia, unspecified: Secondary | ICD-10-CM

## 2015-07-20 DIAGNOSIS — I610 Nontraumatic intracerebral hemorrhage in hemisphere, subcortical: Secondary | ICD-10-CM

## 2015-07-20 DIAGNOSIS — R221 Localized swelling, mass and lump, neck: Secondary | ICD-10-CM

## 2015-07-20 DIAGNOSIS — G4733 Obstructive sleep apnea (adult) (pediatric): Secondary | ICD-10-CM

## 2015-07-20 DIAGNOSIS — I6789 Other cerebrovascular disease: Secondary | ICD-10-CM

## 2015-07-20 DIAGNOSIS — I161 Hypertensive emergency: Secondary | ICD-10-CM

## 2015-07-20 LAB — ECHOCARDIOGRAM COMPLETE
HEIGHTINCHES: 71 in
Weight: 4116.43 oz

## 2015-07-20 MED ORDER — LISINOPRIL 20 MG PO TABS
20.0000 mg | ORAL_TABLET | Freq: Two times a day (BID) | ORAL | Status: DC
  Administered 2015-07-20 – 2015-07-22 (×5): 20 mg via ORAL
  Filled 2015-07-20 (×5): qty 1

## 2015-07-20 MED ORDER — AMLODIPINE BESYLATE 10 MG PO TABS
10.0000 mg | ORAL_TABLET | Freq: Every day | ORAL | Status: DC
  Administered 2015-07-20 – 2015-07-22 (×3): 10 mg via ORAL
  Filled 2015-07-20 (×3): qty 1

## 2015-07-20 MED ORDER — POTASSIUM CHLORIDE CRYS ER 20 MEQ PO TBCR
40.0000 meq | EXTENDED_RELEASE_TABLET | ORAL | Status: AC
  Administered 2015-07-20 (×2): 40 meq via ORAL
  Filled 2015-07-20 (×2): qty 2

## 2015-07-20 MED ORDER — PANTOPRAZOLE SODIUM 40 MG PO TBEC
40.0000 mg | DELAYED_RELEASE_TABLET | Freq: Every day | ORAL | Status: DC
  Administered 2015-07-20 – 2015-07-22 (×3): 40 mg via ORAL
  Filled 2015-07-20 (×3): qty 1

## 2015-07-20 MED ORDER — ALPRAZOLAM 0.5 MG PO TABS
0.5000 mg | ORAL_TABLET | Freq: Three times a day (TID) | ORAL | Status: DC | PRN
  Administered 2015-07-20: 0.5 mg via ORAL
  Filled 2015-07-20: qty 1

## 2015-07-20 MED ORDER — IOPAMIDOL (ISOVUE-370) INJECTION 76%
INTRAVENOUS | Status: AC
  Administered 2015-07-20: 100 mL
  Filled 2015-07-20: qty 100

## 2015-07-20 MED ORDER — ATORVASTATIN CALCIUM 10 MG PO TABS
20.0000 mg | ORAL_TABLET | Freq: Every day | ORAL | Status: DC
  Administered 2015-07-20 – 2015-07-22 (×3): 20 mg via ORAL
  Filled 2015-07-20: qty 2
  Filled 2015-07-20: qty 1
  Filled 2015-07-20: qty 2

## 2015-07-20 NOTE — Care Management Note (Signed)
Case Management Note  Patient Details  Name: Glenn Liu MRN: [id number] Date of Birth: Jan 29, 1961  Subjective/Objective:  Pt admitted on 07/19/15 s/p ICH.  PTA, pt independent, lives with spouse.                   Action/Plan: Will follow for discharge planning as pt progresses. Will need PT/OT evaluations when stable for therapies.    Expected Discharge Date:                  Expected Discharge Plan:  [place]  In-House Referral:     Discharge planning Services  CM Consult  Post Acute Care Choice:    Choice offered to:     DME Arranged:    DME Agency:     HH Arranged:    [place] Agency:     Status of Service:  In process, will continue to follow  Medicare Important Message Given:    Date Medicare IM Given:    Medicare IM give by:    Date Additional Medicare IM Given:    Additional Medicare Important Message give by:     If discussed at [place] of Stay Meetings, dates discussed:    Additional Comments:  Reinaldo Raddle, RN, BSN  Trauma/Neuro ICU Case Manager [phone]

## 2015-07-20 NOTE — Progress Notes (Signed)
STROKE TEAM PROGRESS NOTE   HISTORY OF PRESENT ILLNESS Glenn Liu is an 55 y.o. male patient who was brought into the emergency room from church with transient symptoms of altered mental status and right-sided weakness. 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 All these symptoms lasted around 15-20 minutes per history from patient. When he was brought into the triage, his blood pressures reportedly in 120s the triage recording. Initial CT of the head showed a left thalamic intracerebral hemorrhage with no significant mass effect or intraventricular extension. His blood pressure in ER was extremely high at systolics [id number]. At this time patient denies any focal sensorimotor symptoms or vision speech problems no incoordination. He feels he is at his baseline now. Patient was not administered IV t-PA secondary to [place]. He was admitted to the neuro ICU for further evaluation and treatment.   SUBJECTIVE (INTERVAL HISTORY) His RN is at the bedside.  He recounted his HPI with Dr. Erlinda Hong. not on BP meds at home - no known dx of HTN. His BP was high in ER though. He denies any speech difficulty on presentation and his right side weakness no resolved as per pt. He remains on 2 BP drips - RN has asked for po meds throughout the night. Will put on PO meds and taper off drips. He has right side neck mass for a while and will need outpt follow up with ENT.    OBJECTIVE Temp:  [97.9 F (36.6 C)-98.7 F (37.1 C)] 98.7 F (37.1 C) (04/24 0800) Pulse Rate:  [68-117] 70 (04/24 0700) Cardiac Rhythm:  [-] Normal sinus rhythm (04/24 0600) Resp:  [7-31] 14 (04/24 0700) BP: (96-254)/(56-150) 109/61 mmHg (04/24 0700) SpO2:  [89 %-100 %] 97 % (04/24 0700) Arterial Line BP: (103-222)/(44-103) 147/63 mmHg (04/24  0700) Weight:  [116.7 kg (257 lb 4.4 oz)-122.244 kg (269 lb 8 oz)] 116.7 kg (257 lb 4.4 oz) (04/23 1623)  CBC:   Recent Labs Lab 07/19/15 1321 07/19/15 1355  WBC 11.5*  --   NEUTROABS 8.9*  --   HGB 17.2* 18.0*  HCT 48.0 53.0*  MCV 79.3  --   PLT 198  --     Basic Metabolic Panel:   Recent Labs Lab 07/19/15 1321 07/19/15 1355  NA 140 141  K 3.3* 3.3*  CL 103 102  CO2 25  --   GLUCOSE 103* 98  BUN 13 18  CREATININE 1.34* 1.20  CALCIUM 9.5  --     Lipid Panel:     Component Value Date/Time   CHOL 193 07/19/2015 1708   TRIG 104 07/19/2015 1708   HDL 36* 07/19/2015 1708   CHOLHDL 5.4 07/19/2015 1708   VLDL 21 07/19/2015 1708   LDLCALC 136* 07/19/2015 1708   HgbA1c: No results found for: HGBA1C Urine Drug Screen: No results found for: LABOPIA, COCAINSCRNUR, LABBENZ, AMPHETMU, THCU, LABBARB    IMAGING I have personally reviewed the radiological images below and agree with the radiology interpretations.  Ct Head Wo Contrast 07/19/2015   Acute left thalamic hemorrhage as described above. Volume is estimated at 3.6 cc.   Ct Angio Head & Neck W/cm &/or Wo/cm 07/19/2015  1. Negative for CTA spot sign or evidence of vascular malformation associated with the left thalamic region hemorrhage. 2. Intra- and  extra cranial atherosclerosis appears advanced for age. No anterior circulation large vessel stenosis despite plaque, but there is bilateral MCA M2/M3 mild to moderate irregularity and stenosis. 3. Posterior circulation remarkable for moderate to severe stenosis at the right vertebral artery origin, and mild stenosis at the left PCA origin. 4. There is a large cystic right neck mass measuring 5.7 cm. 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 ENT follow-up is necessary.   CTA aorta -  pending  2D echo - - Normal LV systolic function; grade 1 diastolic dysfunction with  elevated LV filliing pressure; moderate LVH; mild LAE; mild AI;  echodensity in aortic arch/descending aorta (? reverberation;  cannot exclude dissection); suggest CTA or MRA to further assess.    PHYSICAL EXAM  Temp:  [97.9 F (36.6 C)-98.7 F (37.1 C)] 98 F (36.7 C) (04/24 1200) Pulse Rate:  [66-117] 66 (04/24 1030) Resp:  [7-31] 27 (04/24 1030) BP: (96-254)/(54-150) 137/76 mmHg (04/24 0900) SpO2:  [89 %-100 %] 89 % (04/24 1030) Arterial Line BP: (103-222)/(44-103) 137/59 mmHg (04/24 1030) Weight:  [257 lb 4.4 oz (116.7 kg)-269 lb 8 oz (122.244 kg)] 257 lb 4.4 oz (116.7 kg) (04/23 1623)  General - Well nourished, well developed, in no apparent distress.  Ophthalmologic - Sharp disc margins OU.   Cardiovascular - Regular rate and rhythm with no murmur.  Neck - right neck palpable mass, no tenderness  Mental Status -  Level of arousal and orientation to time, place, and person were intact. Language including expression, naming, repetition, comprehension was assessed and found intact. Fund of Knowledge was assessed and was intact.  Cranial Nerves II - XII - II - Visual field intact OU. III, IV, VI - Extraocular movements intact. V - Facial sensation intact bilaterally. VII - Facial movement intact bilaterally. VIII - Hearing & vestibular intact bilaterally. X - Palate elevates symmetrically. XI - Chin turning & shoulder shrug intact bilaterally. XII - Tongue protrusion intact.  Motor Strength - The patient's strength was normal in all extremities and subtle pronator drift was present on the right.  Bulk was normal and fasciculations were absent.   Motor Tone - Muscle tone was assessed at the neck and appendages and was normal.  Reflexes - The patient's reflexes were 1+ in all extremities and he had no pathological reflexes.  Sensory - Light touch, temperature/pinprick were  assessed and were symmetrical.    Coordination - The patient had normal movements in the hands and feet with no ataxia or dysmetria.  Tremor was absent.  Gait and Station - not tested due to HTN on two drips.   ASSESSMENT/PLAN [name] is a 55 y.o. male with history of hypertension presenting with altered mental status and right-sided weakness. CT showed a left thalamic intracerebral hemorrhage.   Stroke:  Dominant left thalamic intracerebral hemorrhage secondary to hypertensive emergency  Resultant  Subtle R arm pronator drift  CTA head & Neck Bilateral MCA M2/M3 moderate irregularity and stenosis; moderate to severe right vertebral artery and left PCA origin stenosis;  large cystic right neck mass measuring 5.7 cm  2D Echo  aortic arch/descending aorta (? Reverberation), cannot exclude dissection   CTA aorta pending  LDL 136  HgbA1c pending  SCDs for VTE prophylaxis Diet Heart Room service appropriate?: Yes; Fluid consistency:: Thin  No  antithrombotic prior to admission  Ongoing aggressive stroke risk factor management  Therapy recommendations:  Pending. Ok to be OOB.   Disposition:  pending   Hypertensive emergency  SBP 260s on arrival in setting of neurologic symptoms  started on clevidipine, labetalol drip added, clevidipine changed to nicardipine  SBP goal < 160  Not on blood pressure medicines PTA  Add norvasc 10 mg daily and lisinopril 20 mg bid  Wean cardene and labetalol drips  Stable currently  Hyperlipidemia  Home meds:  No statin  LDL 136  Add lipitor 20 mg daily  Continue on discharge  Other Stroke Risk Factors  Obesity, Body mass index is 35.9 kg/(m^2).   Probable sleep apnea, needs OP eval  Other Active Problems  Hypokalemia, K 3.3 replace. Recheck in am  R large cystic right neck mass, needs OP ENT follow up  Hospital day # 1  This patient is critically ill due to left thalamic ICH and hypertensive emergency and  questionable aortic dissection and at significant risk of neurological worsening, death form re-bleed, PRES, heart failure. This patient's care requires constant monitoring of vital signs, hemodynamics, respiratory and cardiac monitoring, review of multiple databases, neurological assessment, discussion with family, other specialists and medical decision making of high complexity. I spent 35 minutes of neurocritical care time in the care of this patient.   Rosalin Hawking, MD PhD Stroke Neurology 07/20/2015 1:33 PM   To contact Stroke Continuity provider, please refer to [email or web address] After hours, contact General Neurology

## 2015-07-20 NOTE — Evaluation (Signed)
Speech Language Pathology Evaluation Patient Details Name: Glenn Liu MRN: [id number] DOB: 1960-11-14 Today's Date: 07/20/2015 Time: [id number] SLP Time Calculation (min) (ACUTE ONLY): 18 min  Problem List:  Patient Active Problem List   Diagnosis Date Noted  . ICH (intracerebral hemorrhage) ([place]) 07/19/2015   Past Medical History: History reviewed. No pertinent past medical history. Past Surgical History: History reviewed. No pertinent past surgical history. HPI:  55 y.o. male with h/o HTN who presented to ED with AMS and R side weakness which lasted around 15-20 minutes. Per neurology note, pt feels he is at baseline now. CT Head 4/23 acute L thalamic hemorrhage. Volume estimated at 3.6 cc. No prior h/o SLP intervention.   Assessment / Plan / Recommendation Clinical Impression  SLP adminsitered Cognistat standardized assessment to evaluate pt's speech, language and cognition skills s/p CVA. Pt demonstrated short term memory retrieval deficits. Per pt report, current memory skills demonstrated are not baseline. No other deficits noted. Pt educated re: results of Cognistat and further f/u by SLP. Also provided compensatory strategies to utilize during acute hospital admission. Will continue to follow to provide cognition tx focusing on improving memory retrieval.    SLP Assessment  Patient needs continued Speech Lanaguage Pathology Services    Follow Up Recommendations   (TBD)    Frequency and Duration min 2x/week  2 weeks      SLP Evaluation Prior Functioning  Cognitive/Linguistic Baseline: Within functional limits Type of Home: House Vocation: Full time employment Systems developer)   Cognition  Overall Cognitive Status: Impaired/Different from baseline Arousal/Alertness: Awake/alert Orientation Level: Oriented X4 Attention: Focused;Sustained Focused Attention: Appears intact Sustained Attention: Appears intact Memory: Impaired Memory Impairment: Retrieval deficit;Storage  deficit Awareness: Appears intact Problem Solving: Appears intact Executive Function: Reasoning Reasoning: Appears intact Safety/Judgment: Appears intact    Comprehension  Auditory Comprehension Overall Auditory Comprehension: Appears within functional limits for tasks assessed Yes/No Questions: Within Functional Limits Commands: Within Functional Limits Conversation: Simple Visual Recognition/Discrimination Discrimination: Not tested Reading Comprehension Reading Status: Not tested    Expression Expression Primary Mode of Expression: Verbal Verbal Expression Overall Verbal Expression: Appears within functional limits for tasks assessed Initiation: No impairment Level of Generative/Spontaneous Verbalization: Conversation Repetition: No impairment Naming: No impairment Pragmatics: No impairment Non-Verbal Means of Communication: Not applicable Written Expression Dominant Hand: Right Written Expression: Not tested   Oral / Motor  Oral Motor/Sensory Function Overall Oral Motor/Sensory Function: Within functional limits Motor Speech Overall Motor Speech: Appears within functional limits for tasks assessed Respiration: Within functional limits Phonation: Normal Resonance: Within functional limits Articulation: Within functional limitis Intelligibility: Intelligible Motor Planning: Witnin functional limits Motor Speech Errors: Not applicable   Glenn Liu, Student-SLP                  Glenn Liu 07/20/2015, 12:21 PM

## 2015-07-20 NOTE — Progress Notes (Signed)
  Echocardiogram 2D Echocardiogram has been performed.  Glenn Liu M 07/20/2015, 11:35 AM

## 2015-07-20 NOTE — Progress Notes (Signed)
PT Cancellation Note  Patient Details Name: Glenn Liu MRN: [id number] DOB: 11/22/1960   Cancelled Treatment:    Reason Eval/Treat Not Completed: Patient not medically ready (active bedrest orders at this time), will follow   Duncan Dull 07/20/2015, 8:02 AM Alben Deeds, PT DPT  [phone]

## 2015-07-20 NOTE — Progress Notes (Signed)
PULMONARY / CRITICAL CARE MEDICINE   Name: Glenn Liu MRN: [id number] DOB: 09-25-1960    ADMISSION DATE:  07/19/2015 CONSULTATION DATE:  07/19/2015  REFERRING MD:  Neurology  CHIEF COMPLAINT:  Rt sided weakness  SUBJECTIVE:  Feels sleepy.  He reports snoring, and has been told he stops breathing while asleep.  VITAL SIGNS: BP 109/61 mmHg  Pulse 70  Temp(Src) 98.7 F (37.1 C) (Oral)  Resp 14  Ht 5\' 11"  (1.803 m)  Wt 257 lb 4.4 oz (116.7 kg)  BMI 35.90 kg/m2  SpO2 97%  INTAKE / OUTPUT: I/O last 3 completed shifts: In: 1306.6 [P.O.:240; I.V.:1066.6] Out: 1250 [Urine:1250]  PHYSICAL EXAMINATION: General:  alert Neuro:  Moves all extremities HEENT:  No stridor, MP  Cardiovascular:  Regular, no murmur Lungs:  No wheeze Abdomen:  Soft, non tender Musculoskeletal:  No edema Skin:  No rashes  LABS:  BMET  Recent Labs Lab 07/19/15 1321 07/19/15 1355  NA 140 141  K 3.3* 3.3*  CL 103 102  CO2 25  --   BUN 13 18  CREATININE 1.34* 1.20  GLUCOSE 103* 98    Electrolytes  Recent Labs Lab 07/19/15 1321  CALCIUM 9.5    CBC  Recent Labs Lab 07/19/15 1321 07/19/15 1355  WBC 11.5*  --   HGB 17.2* 18.0*  HCT 48.0 53.0*  PLT 198  --     Coag's  Recent Labs Lab 07/19/15 1321  APTT 32  INR 1.04    Liver Enzymes  Recent Labs Lab 07/19/15 1321  AST 29  ALT 23  ALKPHOS 134*  BILITOT 0.9  ALBUMIN 3.8    Glucose  Recent Labs Lab 07/19/15 1337  GLUCAP 86    Imaging Ct Angio Head W/cm &/or Wo Cm  07/19/2015  CLINICAL DATA:  55 year old male with acute right side weakness and dizziness found have an acute left thalamic hemorrhage on noncontrast head CT. Initial encounter. EXAM: CT ANGIOGRAPHY HEAD AND NECK TECHNIQUE: Multidetector CT imaging of the head and neck was performed using the standard protocol during bolus administration of intravenous contrast. Multiplanar CT image reconstructions and MIPs were obtained to evaluate the vascular  anatomy. Carotid stenosis measurements (when applicable) are obtained utilizing NASCET criteria, using the distal internal carotid diameter as the denominator. CONTRAST:  75 mL Isovue 370 COMPARISON:  Noncontrast head CT 1347 hours today. FINDINGS: CTA NECK Skeleton: Occasional poor dentition. Lower cervical spine chronic disc and endplate degeneration. No acute osseous abnormality identified. Left maxillary sinus mucosal thickening. Other neck: Negative lung apices. No superior mediastinal lymphadenopathy. Negative thyroid, larynx, pharynx, parapharyngeal spaces, retropharyngeal space and sublingual space. No pharyngeal mass identified. Submandibular glands and parotid glands are within normal limits. Visualized orbit soft tissues are within normal limits. Left side cervical lymph nodes are within normal limits; there is a prominent left level IIa node as seen on sagittal image 179, but this has a maintained normal fatty hilum. There is a large cystic right neck mass situated at the level 2 nodal station between the right carotid space and right sternocleidomastoid muscle. This has simple fluid densitometry (10-15 Hounsfield units) and encompasses 57 x 32 x 42 mm (AP by transverse by CC). No surrounding inflammatory stranding. There does appear to be mild thickening of the wall of the lesion in some areas (series 501, image 87). Otherwise no right side lymphadenopathy. Aortic arch: 3 vessel arch configuration. No arch atherosclerosis. No great vessel origin stenosis. Right carotid system: Mildly tortuous proximal right CCA. Soft plaque  along the anterior wall of the right CCA at the larynx without stenosis. Circumferential soft plaque at the right carotid bifurcation with less than 50 % stenosis with respect to the distal vessel. Left carotid system: Negative proximal left CCA. Mild soft plaque in the medial left CCA at the level of the larynx, no stenosis. Circumferential soft plaque at the left carotid  bifurcation. No stenosis. Mildly tortuous cervical left ICA. Vertebral arteries: Tortuous proximal right subclavian artery without stenosis. Soft plaque at the right vertebral artery origin resulting in moderate to severe stenosis (series 501, image 35). Otherwise negative right vertebral artery to the skullbase. No proximal left subclavian artery stenosis. Normal left vertebral artery origin. Tortuous left V1 segment. The left vertebral artery is mildly dominant and normal to the skullbase. CTA HEAD Posterior circulation: Mildly dominant distal left vertebral artery. Normal PICA vessels and vertebrobasilar junction. No distal vertebral stenosis. No basilar artery abnormality. Normal SCA and right PCA origins. There is mild irregularity and stenosis at the left PCA origin (series 503, image 109). Left posterior communicating artery is present. The right is diminutive or absent. Bilateral PCA branches are within normal limits. No CTA spot sign at the left thalamus region intra-axial hemorrhage. No evidence of associated vascular malformation. Anterior circulation: Both ICA siphons are patent with mild calcified plaque and no stenosis. Normal ophthalmic and left posterior communicating artery origins. Normal carotid termini. Normal MCA and ACA origins. The anterior communicating artery is ectatic, but without discrete aneurysm. Bilateral ACA branches are normal aside from mild ectasia/tortuosity. Right MCA M1 segment, bifurcation, and most right MCA branches are within normal limits. There is mild to moderate irregularity and stenosis in the proximal aspect of the dominant left posterior sylvian division (series 506, image 16). Left MCA M1 segment, bifurcation, and most left MCA branches are within normal limits. There is mild irregularity and stenosis in the posterior left MCA M2/M3 branches (series 506, image 32). Venous sinuses: Patent. Anatomic variants: Mildly dominant left vertebral artery and mildly fetal type  left PCA origin. IMPRESSION: 1. Negative for CTA spot sign or evidence of vascular malformation associated with the left thalamic region hemorrhage. 2. 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 No anterior circulation large vessel stenosis despite plaque, but there is bilateral MCA M2/M3 mild to moderate irregularity and stenosis. 3. Posterior circulation remarkable for moderate to severe stenosis at the right vertebral artery origin, and mild stenosis at the left PCA origin. 4. There is a large cystic right neck mass measuring 5.7 cm. 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 ENT follow-up is necessary. Study discussed by telephone with Dr. Audria Nine on 07/19/2015 at 16:44 . Electronically Signed   By: Genevie Ann M.D.   On: 07/19/2015 16:47   Ct Head Wo Contrast  07/19/2015  CLINICAL DATA:  Right side weakness and dizziness. Initial encounter. EXAM: CT HEAD WITHOUT CONTRAST TECHNIQUE: Contiguous axial images were obtained from the base of the skull through the vertex without intravenous contrast. COMPARISON:  None. FINDINGS: The patient has an acute hemorrhage in the left thalamus measuring approximately 2.0 x 1.8 cm in the axial plane x 2 cm craniocaudal for total volume of 3.6 cc. Also seen is some chronic microvascular ischemic change. No mass lesion, midline shift, hydrocephalus, subarachnoid or subdural hemorrhage is identified.  Mucosal thickening in the maxillary sinuses is worse on the left. The calvarium is intact. IMPRESSION: Acute left thalamic hemorrhage as described above. Volume is estimated at 3.6 cc. These results were called by telephone at the time of interpretation on 07/19/2015 at 2:00 pm to Dr. Silverio Decamp, who verbally acknowledged these results. Electronically Signed   By: Inge Rise M.D.   On: 07/19/2015 14:01   Ct Angio Neck W/cm &/or Wo/cm  07/19/2015  CLINICAL DATA:  55 year old male with acute right side weakness and dizziness found have an acute left thalamic hemorrhage on noncontrast head CT. Initial encounter. EXAM: CT ANGIOGRAPHY HEAD AND NECK TECHNIQUE: Multidetector CT imaging of the head and neck was performed using the standard protocol during bolus administration of intravenous contrast. Multiplanar CT image reconstructions and MIPs were obtained to evaluate the vascular anatomy. Carotid stenosis measurements (when applicable) are obtained utilizing NASCET criteria, using the distal internal carotid diameter as the denominator. CONTRAST:  75 mL Isovue 370 COMPARISON:  Noncontrast head CT 1347 hours today. FINDINGS: CTA NECK Skeleton: Occasional poor dentition. Lower cervical spine chronic disc and endplate degeneration. No acute osseous abnormality identified. Left maxillary sinus mucosal thickening. Other neck: Negative lung apices. No superior mediastinal lymphadenopathy. Negative thyroid, larynx, pharynx, parapharyngeal spaces, retropharyngeal space and sublingual space. No pharyngeal mass identified. Submandibular glands and parotid glands are within normal limits. Visualized orbit soft tissues are within normal limits. Left side cervical lymph nodes are within normal limits; there is a prominent left level IIa node as seen on sagittal image 179, but this has a maintained normal fatty hilum. There is a large cystic right neck mass situated at the level 2 nodal station between the right carotid space and right sternocleidomastoid muscle. This has simple fluid densitometry (10-15 Hounsfield units) and encompasses 57 x 32 x 42 mm (AP by transverse by CC). No surrounding inflammatory stranding. There does appear to be mild thickening of the wall of the lesion in some areas (series 501, image 87). Otherwise no right side lymphadenopathy. Aortic arch: 3 vessel arch  configuration. No arch atherosclerosis. No great vessel origin stenosis. Right carotid system: Mildly tortuous proximal right CCA. Soft plaque along the anterior wall of the right CCA at the larynx without stenosis. Circumferential soft plaque at the right carotid bifurcation with less than 50 % stenosis with respect to the distal vessel. Left carotid system: Negative proximal left CCA. Mild soft plaque in the medial left CCA at the level of the larynx, no stenosis. Circumferential soft plaque at the left carotid bifurcation. No stenosis. Mildly tortuous cervical left ICA. Vertebral arteries: Tortuous proximal right subclavian artery without stenosis. Soft plaque at the right vertebral artery origin resulting in moderate to severe stenosis (series 501, image 35). Otherwise negative right vertebral artery to the skullbase. No proximal left subclavian artery stenosis. Normal left vertebral artery origin. Tortuous left V1 segment. The left vertebral artery is mildly dominant and normal to the skullbase. CTA HEAD Posterior circulation: Mildly dominant distal left vertebral artery. Normal PICA vessels and vertebrobasilar junction. No distal vertebral stenosis. No basilar artery abnormality. Normal SCA and right PCA origins. There is mild irregularity and stenosis at the left PCA origin (series 503, image 109). Left posterior communicating artery is present. The right is diminutive or absent. Bilateral PCA branches are within normal limits. No CTA spot sign at the left thalamus region intra-axial hemorrhage. No evidence of associated vascular malformation. Anterior circulation: Both ICA siphons are patent with mild calcified plaque and no  stenosis. Normal ophthalmic and left posterior communicating artery origins. Normal carotid termini. Normal MCA and ACA origins. The anterior communicating artery is ectatic, but without discrete aneurysm. Bilateral ACA branches are normal aside from mild ectasia/tortuosity. Right MCA M1  segment, bifurcation, and most right MCA branches are within normal limits. There is mild to moderate irregularity and stenosis in the proximal aspect of the dominant left posterior sylvian division (series 506, image 16). Left MCA M1 segment, bifurcation, and most left MCA branches are within normal limits. There is mild irregularity and stenosis in the posterior left MCA M2/M3 branches (series 506, image 32). Venous sinuses: Patent. Anatomic variants: Mildly dominant left vertebral artery and mildly fetal type left PCA origin. IMPRESSION: 1. Negative for CTA spot sign or evidence of vascular malformation associated with the left thalamic region hemorrhage. 2. 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 No anterior circulation large vessel stenosis despite plaque, but there is bilateral MCA M2/M3 mild to moderate irregularity and stenosis. 3. Posterior circulation remarkable for moderate to severe stenosis at the right vertebral artery origin, and mild stenosis at the left PCA origin. 4. There is a large cystic right neck mass measuring 5.7 cm. 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 ENT follow-up is necessary. Study discussed by telephone with Dr. Audria Nine on 07/19/2015 at 16:44 . Electronically Signed   By: Genevie Ann M.D.   On: 07/19/2015 16:47     STUDIES:  4/23 CT head >> Acute lt thalamic hemorrhage  CULTURES:  ANTIBIOTICS:  SIGNIFICANT EVENTS: 4/23 Admit  LINES/TUBES:  DISCUSSION: 55 yo male with Rt sided weakness from HTN emergency and Lt thalamic ICH.  ASSESSMENT / PLAN:  PULMONARY A: Presumed OSA. P:   Trial of CPAP qhs F/u sleep evaluation as outpt  CARDIOVASCULAR A:  HTN emergency. HLD. P:  Goal SBP 120 to 140 per neurology Transitioning to po meds F/u  Echo  RENAL A:   Hypokalemia. P:   Replace electrolytes as needed  GASTROINTESTINAL A:   Nutrition. P:   Regular diet  HEMATOLOGIC A:   Polycythemia >> ? Related to nocturnal hypoxemia from OSA. P:  F/u CBC  INFECTIOUS A:   No evidence for infection. P:   Monitor clinically  ENDOCRINE A:   No acute issues. P:   F/u HbA1c  NEUROLOGIC A:   Acute Lt thalamic ICH. P:   Per neurology  ENT A: Cystic mass Rt neck >> ?benign fluid collection. P: Assessment as outpt  D/w Dr. Erlinda Hong.  CC time 32 minutes.  Chesley Mires, MD [facility] Pulmonary/Critical Care 07/20/2015, 9:53 AM Pager:  [phone] After 3pm call: [phone]

## 2015-07-20 NOTE — Progress Notes (Signed)
Rehab Admissions Coordinator Note:  Patient was screened by Cleatrice Burke for appropriateness for an Inpatient Acute Rehab Consult per Pt recommendation. At this time, we are recommending Inpatient Rehab consult.  Cleatrice Burke 07/20/2015, 4:56 PM  I can be reached at [phone].

## 2015-07-20 NOTE — Progress Notes (Signed)
   07/20/15 1300  Clinical Encounter Type  Visited With Patient  Visit Type Spiritual support  Referral From Nurse  Spiritual Encounters  Spiritual Needs Literature  Patient wished to have information about HCPOA and Living Will. Chaplain explained them to him, then got him a copy and walked him through it. He indicated he would try to complete it this afternoon, and chaplain told him to have his nurse let us know when it was ready to be notarized--that it would probably be tomorrow. Patient said that would be fine.

## 2015-07-20 NOTE — Evaluation (Signed)
Physical Therapy Evaluation Patient Details Name: Glenn Liu MRN: [id number] DOB: October 31, 1960 Today's Date: 07/20/2015   History of Present Illness  55 y.o. male with h/o HTN who presented to ED with AMS and R side weakness which lasted around 15-20 minutes. Per neurology note, pt feels he is at baseline now. CT Head 4/23 acute L thalamic hemorrhage  Clinical Impression  Patient demonstrates deficits in functional mobility as indicated below. Will benefit from continued skilled PT to address deficits and maximize function. Will see as indicated and progress as tolerated. OF NOTE: patient with poor coordination and instability noted with ambulation. HIGH FALL RISK, patient also with very poor awareness of deficits at this time. Recommend comprehensive therapies to address functional and cognitive deficits upon acute discharge. CIR consult recommended.    Follow Up Recommendations CIR;Supervision/Assistance - 24 hour    Equipment Recommendations  Other (comment) (TBD, possibly RW)    Recommendations for Other Services Rehab consult     Precautions / Restrictions Precautions Precautions: Fall (watch BP)      Mobility  Bed Mobility Overal bed mobility: Needs Assistance Bed Mobility: Sit to Supine       Sit to supine: Min assist   General bed mobility comments: min assist to elevate LEs to bed and reposition  Transfers Overall transfer level: Needs assistance Equipment used: 1 person hand held assist Transfers: Sit to/from Stand Sit to Stand: Min assist         General transfer comment: min assist to come to standing, posterior LOB noted and instability in static standing upon reaching upright  Ambulation/Gait Ambulation/Gait assistance: Mod assist Ambulation Distance (Feet): 60 Feet Assistive device: 1 person hand held assist Gait Pattern/deviations: Shuffle;Ataxic;Staggering right;Staggering left;Wide base of support Gait velocity: decreased Gait velocity  interpretation: Below normal speed for age/gender General Gait Details: patient with poorly coordinated gait, multiple LOB noted. Patient required increased physical assist to maintain stability. Easily fatigues with limited mobility (cues to improve cadence)  Stairs            Wheelchair Mobility    Modified Rankin (Stroke Patients Only) Modified Rankin (Stroke Patients Only) Pre-Morbid Rankin Score: No symptoms Modified Rankin: Moderately severe disability     Balance Overall balance assessment: Needs assistance Sitting-balance support: Feet supported Sitting balance-Leahy Scale: Good     Standing balance support: During functional activity;No upper extremity supported Standing balance-Leahy Scale: Poor Standing balance comment: requires assist to maintain stability, increased lateral sway noted                             Pertinent Vitals/Pain Pain Assessment: No/denies pain    Home Living Family/patient expects to be discharged to:: Private residence Living Arrangements: Spouse/significant other;Children Available Help at Discharge: Family Type of Home: House Home Access: Level entry     Home Layout: One level Home Equipment: None      Prior Function Level of Independence: Independent               Hand Dominance   Dominant Hand: Right    Extremity/Trunk Assessment   Upper Extremity Assessment: Defer to OT evaluation           Lower Extremity Assessment:  (strength WFL, poor coordination during gait)      Cervical / Trunk Assessment:  (increased body habitus)  Communication   Communication: No difficulties  Cognition Arousal/Alertness: Awake/alert Behavior During Therapy: Impulsive Overall Cognitive Status: Impaired/Different from baseline Area of Impairment:  Attention;Safety/judgement;Awareness;Problem solving   Current Attention Level: Selective     Safety/Judgement: Decreased awareness of safety;Decreased awareness  of deficits Awareness: Emergent Problem Solving: Requires verbal cues;Requires tactile cues      General Comments      Exercises        Assessment/Plan    PT Assessment Patient needs continued PT services  PT Diagnosis Difficulty walking;Abnormality of gait;Altered mental status   PT Problem List Decreased strength;Decreased activity tolerance;Decreased balance;Decreased mobility;Decreased coordination;Decreased cognition;Cardiopulmonary status limiting activity;Obesity  PT Treatment Interventions DME instruction;Gait training;Stair training;Functional mobility training;Therapeutic activities;Therapeutic exercise;Balance training;Cognitive remediation   PT Goals (Current goals can be found in the Care Plan section) Acute Rehab PT Goals Patient Stated Goal: to get better and go home PT Goal Formulation: With patient Time For Goal Achievement: 08/03/15 Potential to Achieve Goals: Good    Frequency Min 4X/week   Barriers to discharge        Co-evaluation               End of Session Equipment Utilized During Treatment: Gait belt Activity Tolerance: Patient limited by fatigue Patient left: in bed;with call bell/phone within reach;with bed alarm set Nurse Communication: Mobility status         Time: 1440-1501 PT Time Calculation (min) (ACUTE ONLY): 21 min   Charges:   PT Evaluation $PT Eval High Complexity: 1 Procedure     PT G CodesDuncan Dull 08-18-15, 4:27 PM Alben Deeds, [place] DPT  [phone]

## 2015-07-21 DIAGNOSIS — E876 Hypokalemia: Secondary | ICD-10-CM

## 2015-07-21 DIAGNOSIS — R269 Unspecified abnormalities of gait and mobility: Secondary | ICD-10-CM

## 2015-07-21 DIAGNOSIS — N179 Acute kidney failure, unspecified: Secondary | ICD-10-CM

## 2015-07-21 DIAGNOSIS — G4733 Obstructive sleep apnea (adult) (pediatric): Secondary | ICD-10-CM | POA: Diagnosis present

## 2015-07-21 DIAGNOSIS — I69398 Other sequelae of cerebral infarction: Secondary | ICD-10-CM

## 2015-07-21 LAB — CBC
HCT: 40.3 % (ref 39.0–52.0)
Hemoglobin: 13.8 g/dL (ref 13.0–17.0)
MCH: 27.9 pg (ref 26.0–34.0)
MCHC: 34.2 g/dL (ref 30.0–36.0)
MCV: 81.6 fL (ref 78.0–100.0)
Platelets: 165 10*3/uL (ref 150–400)
RBC: 4.94 MIL/uL (ref 4.22–5.81)
RDW: 14 % (ref 11.5–15.5)
WBC: 10.1 10*3/uL (ref 4.0–10.5)

## 2015-07-21 LAB — BASIC METABOLIC PANEL
Anion gap: 11 (ref 5–15)
BUN: 21 mg/dL — ABNORMAL HIGH (ref 6–20)
CALCIUM: 8.4 mg/dL — AB (ref 8.9–10.3)
CHLORIDE: 104 mmol/L (ref 101–111)
CO2: 23 mmol/L (ref 22–32)
CREATININE: 1.45 mg/dL — AB (ref 0.61–1.24)
GFR calc Af Amer: >60 mL/min (ref >60)
GFR calc non Af Amer: 53 mL/min — ABNORMAL LOW (ref >60)
Glucose, Bld: 110 mg/dL — ABNORMAL HIGH (ref 65–99)
Potassium: 3.1 mmol/L — ABNORMAL LOW (ref 3.5–5.1)
SODIUM: 138 mmol/L (ref 135–145)

## 2015-07-21 LAB — HEMOGLOBIN A1C
Hgb A1c MFr Bld: 5.3 % (ref 4.8–5.6)
Mean Plasma Glucose: 105 mg/dL

## 2015-07-21 MED ORDER — LABETALOL HCL 100 MG PO TABS
100.0000 mg | ORAL_TABLET | Freq: Three times a day (TID) | ORAL | Status: DC
  Administered 2015-07-21 (×3): 100 mg via ORAL
  Filled 2015-07-21 (×3): qty 1

## 2015-07-21 MED ORDER — LABETALOL HCL 200 MG PO TABS
200.0000 mg | ORAL_TABLET | Freq: Three times a day (TID) | ORAL | Status: DC
  Administered 2015-07-22 (×2): 200 mg via ORAL
  Filled 2015-07-21 (×2): qty 1

## 2015-07-21 NOTE — Progress Notes (Signed)
Speech Language Pathology Treatment: Cognitive-Linquistic  Patient Details Name: Glenn Liu MRN: [id number] DOB: 02-09-1961 Today's Date: 07/21/2015 Time: [id number] SLP Time Calculation (min) (ACUTE ONLY): 31 min  Assessment / Plan / Recommendation Clinical Impression  Pt currently presents with cognition deficits in the areas of attention, memory, problem solving and awareness. Pt demonstrated poor recall during immediate memory task (~25% accuracy). Pt believes memory deficits likely due to attention deficits (reports dx of ADHD in childhood?) that appear to have increased since acute CVA. Pt able to follow 1-step commands accurately, however accuracy decreased during 2-step commands due to decreased attention. Pt educated re: continued f/u by SLP. Recommend CIR upon d/c to maximize independence before going home.   HPI HPI: 55 y.o. male with h/o HTN who presented to ED with AMS and R side weakness which lasted around 15-20 minutes. Per neurology note, pt feels he is at baseline now. CT Head 4/23 acute L thalamic hemorrhage. Volume estimated at 3.6 cc. No prior h/o SLP intervention.      SLP Plan  Continue with current plan of care     Recommendations   CIR             Oral Care Recommendations: Oral care BID Follow up Recommendations: Inpatient Rehab Plan: Continue with current plan of care     Tiyana Galla, Student-SLP               Glenn Liu 07/21/2015, 1:30 PM

## 2015-07-21 NOTE — Progress Notes (Signed)
STROKE TEAM PROGRESS NOTE    SUBJECTIVE (INTERVAL HISTORY) Family at the bedside. Patient up in chair. He is doing well today. BP better controlled but will add metoprolol for further control. Rehab looking at patient.    OBJECTIVE Temp:  [97.7 F (36.5 C)-98.4 F (36.9 C)] 98.2 F (36.8 C) (04/25 0931) Pulse Rate:  [66-96] 76 (04/25 1028) Cardiac Rhythm:  [-] Normal sinus rhythm (04/25 0703) Resp:  [14-30] 20 (04/25 0931) BP: (90-183)/(46-83) 154/78 mmHg (04/25 1028) SpO2:  [88 %-98 %] 95 % (04/25 0931) Arterial Line BP: (91-184)/(61-84) 161/67 mmHg (04/24 1915) Weight:  [125.147 kg (275 lb 14.4 oz)] 125.147 kg (275 lb 14.4 oz) (04/25 0116)  CBC:   Recent Labs Lab 07/19/15 1321 07/19/15 1355 07/21/15 0204  WBC 11.5*  --  10.1  NEUTROABS 8.9*  --   --   HGB 17.2* 18.0* 13.8  HCT 48.0 53.0* 40.3  MCV 79.3  --  81.6  PLT 198  --  [id number]    Basic Metabolic Panel:   Recent Labs Lab 07/19/15 1321 07/19/15 1355 07/21/15 0204  NA 140 141 138  K 3.3* 3.3* 3.1*  CL 103 102 104  CO2 25  --  23  GLUCOSE 103* 98 110*  BUN 13 18 21*  CREATININE 1.34* 1.20 1.45*  CALCIUM 9.5  --  8.4*    Lipid Panel:     Component Value Date/Time   CHOL 193 07/19/2015 1708   TRIG 104 07/19/2015 1708   HDL 36* 07/19/2015 1708   CHOLHDL 5.4 07/19/2015 1708   VLDL 21 07/19/2015 1708   LDLCALC 136* 07/19/2015 1708   HgbA1c:  Lab Results  Component Value Date   HGBA1C 5.3 07/19/2015    IMAGING  Ct Head Wo Contrast 07/19/2015   Acute left thalamic hemorrhage as described above. Volume is estimated at 3.6 cc.   Ct Angio Head & Neck W/cm &/or Wo/cm 07/19/2015  1. Negative for CTA spot sign or evidence of vascular malformation associated with the left thalamic region hemorrhage. 2. 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 No anterior circulation large vessel stenosis despite plaque, but there is bilateral MCA M2/M3 mild to moderate irregularity and stenosis. 3.  Posterior circulation remarkable for moderate to severe stenosis at the right vertebral artery origin, and mild stenosis at the left PCA origin. 4. There is a large cystic right neck mass measuring 5.7 cm. 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 ENT follow-up is necessary.   CTA chest/Abd/Pelvis 07/20/2015  1. No acute vascular findings in the chest and abdomen or pelvis. No evidence of aortic dissection or aneurysm. Minimal atherosclerosis noted. 2. Probable left ventricular hypertrophy. 3. Left renal cysts, including a small deform it lesion in the lower pole the left kidney. 4. Hepatic steatosis and small hiatal hernia.  2D echo  - Normal LV systolic function; grade 1 diastolic dysfunction with elevated LV filliing pressure; moderate LVH; mild LAE; mild AI; echodensity in aortic arch/descending aorta (? Reverberation; cannot exclude dissection); suggest CTA or MRA to further assess.   PHYSICAL EXAM General - Well nourished, well developed, in no apparent distress.  Ophthalmologic - Sharp disc margins OU.   Cardiovascular - Regular rate and rhythm with no murmur.  Neck - right neck palpable mass, no tenderness  Mental Status -  Level of arousal and orientation to time, place, and person were intact. Language including expression, naming, repetition, comprehension was assessed and found intact. Fund of Knowledge was assessed and was intact.  Cranial Nerves II - XII - II - Visual field intact OU. III, IV, VI - Extraocular movements intact. V - Facial sensation intact bilaterally. VII - Facial movement intact bilaterally. VIII - Hearing & vestibular intact bilaterally. X - Palate elevates symmetrically. XI - Chin turning & shoulder shrug intact bilaterally. XII - Tongue protrusion  intact.  Motor Strength - The patient's strength was normal in all extremities and pronator drift is absent.  Bulk was normal and fasciculations were absent.   Motor Tone - Muscle tone was assessed at the neck and appendages and was normal.  Reflexes - The patient's reflexes were 1+ in all extremities and he had no pathological reflexes.  Sensory - Light touch, temperature/pinprick were assessed and were symmetrical.    Coordination - The patient had normal movements in the hands and feet with no ataxia or dysmetria.  Tremor was absent.  Gait and Station - not tested due to HTN on two drips.   ASSESSMENT/PLAN [name] is a 55 y.o. male with history of hypertension presenting with altered mental status and right-sided weakness. CT showed a left thalamic intracerebral hemorrhage.   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 L renal cysts, LVH  LDL 136  HgbA1c 5.3  SCDs for VTE prophylaxis Diet Heart Room service appropriate?: Yes; Fluid consistency:: Thin  No antithrombotic prior to admission  Ongoing aggressive stroke risk factor management  Therapy recommendations:  CIR  Disposition:  pending   Hypertensive emergency  SBP 260s on arrival in setting of neurologic symptoms  started on clevidipine, labetalol drip added, clevidipine changed to nicardipine. Weaned off when changed to POs  SBP goal < 160  Not on blood pressure medicines PTA  Add norvasc 10 mg daily and lisinopril 20 mg bid and labetalol 200mg  tid  BP 140-170s today.  Stable currently  Hyperlipidemia  Home meds:  No statin  LDL 136  Add lipitor 20 mg  daily  Continue on discharge  Other Stroke Risk Factors  Obesity, Body mass index is 38.5 kg/(m^2).   Probable sleep apnea, needs OP eval  Other Active Problems  Hypokalemia, K 3.3 replace. Recheck in am  R large cystic right neck mass, needs OP ENT follow up  Hospital day # [place] for Pager information 07/21/2015 11:49 AM   I, the attending vascular neurologist, have personally obtained a history, examined the patient, evaluated laboratory data, individually viewed imaging studies and agree with radiology interpretations. Together with the NP/PA, we formulated the assessment and plan of care which reflects our mutual decision.  I have made any additions or clarifications directly to the above note and agree with the findings and plan as currently documented.   Pt doing well, neuro deficit resolved. However BP still not in good control, is on amlodipine and lisinopril max dose. Will add labetolol. Pending CIR  Rosalin Hawking, MD PhD Stroke Neurology 07/21/2015 10:37 PM       To contact Stroke Continuity  provider, please refer to [email or web address] After hours, contact General Neurology

## 2015-07-21 NOTE — Progress Notes (Signed)
Physical Therapy Treatment Patient Details Name: Glenn Liu MRN: [id number] DOB: 1960-08-20 Today's Date: 07/21/2015    History of Present Illness 55 y.o. male with h/o HTN who presented to ED with AMS and R side weakness which lasted around 15-20 minutes. Per neurology note, pt feels he is at baseline now. CT Head 4/23 acute L thalamic hemorrhage    PT Comments    Pt with improved ability to ambulate however remains to require minA for safety. 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 COn't to recommend CIR  upon d/c to achieve safe mod I level of function. Pt with good support system.  Follow Up Recommendations  CIR;Supervision/Assistance - 24 hour     Equipment Recommendations  Rolling walker with 5" wheels    Recommendations for Other Services Rehab consult     Precautions / Restrictions Precautions Precautions: Fall    Mobility  Bed Mobility Overal bed mobility: Needs Assistance Bed Mobility: Supine to Sit     Supine to sit: Supervision     General bed mobility comments: pt impulsively sat up to EOB, increased time, labored effort, used bed rail  Transfers Overall transfer level: Needs assistance Equipment used: 1 person hand held assist Transfers: Sit to/from Stand Sit to Stand: Min assist         General transfer comment: mina for safety due to impulsively fast and decreased proprioception /self awareness  Ambulation/Gait Ambulation/Gait assistance: Min assist Ambulation Distance (Feet): 100 Feet (x3) Assistive device: Rolling walker (2 wheeled);None;1 person hand held assist Gait Pattern/deviations: Wide base of support;Antalgic;Step-through pattern;Decreased stride length Gait velocity: decreased Gait velocity interpretation: Below normal speed for age/gender General Gait Details: Pt unsteady but less ataxic. Pt  initially used L UE HHA and was pushing strongly onto PT. transitioned to RW in which pt demo'd increased stability and improved gait pattern with improved fluidity, increased step length and decreased ataxic however required minA for walker management. then trialed ambulation without AD in which pt requiring minA due to staggering to L/R   Stairs Stairs: Yes Stairs assistance: Min assist Stair Management: Two rails Number of Stairs: 6 General stair comments: directional v/c's for optimal sequencing and safety  Wheelchair Mobility    Modified Rankin (Stroke Patients Only) Modified Rankin (Stroke Patients Only) Pre-Morbid Rankin Score: No symptoms Modified Rankin: Moderate disability     Balance Overall balance assessment: Needs assistance Sitting-balance support: Feet supported Sitting balance-Leahy Scale: Good Sitting balance - Comments: slight lean to the R but able to maintain upright position   Standing balance support: During functional activity Standing balance-Leahy Scale: Fair Standing balance comment: requires external support or RW for improved safety with amb                    Cognition Arousal/Alertness: Awake/alert Behavior During Therapy: Impulsive Overall Cognitive Status: Impaired/Different from baseline Area of Impairment: Attention;Safety/judgement;Awareness;Problem solving   Current Attention Level: Selective Memory: Decreased short-term memory (asked PT her name x4 in 23 min session)   Safety/Judgement: Decreased awareness of safety;Decreased awareness of deficits Awareness: Emergent Problem Solving: Requires verbal cues;Requires tactile cues (for safety)      Exercises      General Comments        Pertinent Vitals/Pain Pain Assessment: No/denies pain    Home Living  Prior Function            PT Goals (current goals can now be found in the care plan section) Acute Rehab PT Goals Patient Stated Goal:  go home Progress towards PT goals: Progressing toward goals    Frequency  Min 4X/week    PT Plan Current plan remains appropriate    Co-evaluation             End of Session Equipment Utilized During Treatment: Gait belt Activity Tolerance: Patient tolerated treatment well Patient left: in chair;with call bell/phone within reach;with chair alarm set     Time: [id number] PT Time Calculation (min) (ACUTE ONLY): 23 min  Charges:  $Gait Training: 23-37 mins                    G Codes:      Kingsley Callander 07/21/2015, 10:36 AM   Kittie Plater, PT, DPT Pager #: [phone] Office #: [phone]

## 2015-07-21 NOTE — H&P (Signed)
Physical Medicine and Rehabilitation Admission H&P    Chief Complaint  Patient presents with  . Weakness  . Gait Problem  . Dizziness  : HPI: Glenn Liu is a 55 y.o. right handed male with unremarkable past medical history. Lives with spouse independent prior to admission. One level home with level entry. Patient is employed as a Environmental education officer. Wife works day shift. 3 children in the area also work. 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 Systolic blood pressures 825.CT of the head showed acute left thalamic hemorrhage 2.0 x 1.8 cm. Placed on a Cardene drip for blood pressure control. CTA of head and neck negative for vascular malformation. No anterior circulation large vessel stenosis. 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 Echocardiogram with ejection fraction 00% grade 1 diastolic dysfunction. Tolerating a regular diet. Presumed OSA with pulmonary service follow-up trial CPAP and follow-up sleep evaluation as outpatient. Physical therapy evaluation completed with recommendations of physical medicine rehabilitation consult.Patient was admitted for a comprehensive rehabilitation program  ROS Constitutional: Negative for fever and chills.  HENT: Negative for hearing loss.   Occasional headache  Eyes: Negative for blurred vision and double vision.  Respiratory: Negative for cough and shortness of breath.  Cardiovascular: Negative for chest pain and palpitations.  Gastrointestinal: Positive for constipation. Negative for nausea and vomiting.  Genitourinary: Negative for dysuria and hematuria.  Musculoskeletal: Positive for  myalgias and joint pain.  Skin: Negative for rash.  Neurological: Negative for seizures.  All other systems reviewed and are negative   History reviewed. No pertinent past medical history. History reviewed. No pertinent past surgical history. Family History  Problem Relation Age of Onset  . Heart attack Mother    Social History:  reports that he has never smoked. He does not have any smokeless tobacco history on file. His alcohol and drug histories are not on file. Allergies: No Known Allergies Medications Prior to Admission  Medication Sig Dispense Refill  . naphazoline-glycerin (CLEAR EYES) 0.012-0.2 % SOLN Place 1-2 drops into both eyes daily as needed for irritation.      Home: Home Living Family/patient expects to be discharged to:: Inpatient rehab Living Arrangements: Spouse/significant other, Children Available Help at Discharge: Family Type of Home: House Home Access: Level entry Home Layout: One level Bathroom Shower/Tub: Gaffer, Chiropodist: Standard Home Equipment: None   Functional History: Prior Function Level of Independence: Independent  Functional Status:  Mobility: Bed Mobility Overal bed mobility: Needs Assistance Bed Mobility: Supine to Sit Supine to sit: Supervision Sit to supine: Min assist General bed mobility comments: not assessed-pt in chair Transfers Overall transfer level: Needs assistance Equipment used: 1 person hand held assist Transfers: Sit to/from Stand Sit to Stand: Supervision General transfer comment: mina for safety due to impulsively fast and decreased proprioception /self awareness Ambulation/Gait Ambulation/Gait assistance: Min assist Ambulation Distance (Feet): 100 Feet (x3) Assistive device: Rolling walker (2 wheeled), None, 1 person hand held assist Gait Pattern/deviations: Wide base of support, Antalgic, Step-through pattern, Decreased stride length General Gait Details: Pt unsteady but less  ataxic. Pt initially used L UE HHA and was pushing strongly onto PT. transitioned to RW in which pt demo'd increased stability  and improved gait pattern with improved fluidity, increased step length and decreased ataxic however required minA for walker management. then trialed ambulation without AD in which pt requiring minA due to staggering to L/R Gait velocity: decreased Gait velocity interpretation: Below normal speed for age/gender Stairs: Yes Stairs assistance: Min assist Stair Management: Two rails Number of Stairs: 6 General stair comments: directional v/c's for optimal sequencing and safety    ADL: ADL Overall ADL's : Needs assistance/impaired Lower Body Bathing: Min guard (standing with UE supported) Lower Body Dressing: Set up, Supervision/safety, Sit to/from stand Toilet Transfer: Ambulation, Min guard (sit to stand from chair) Toilet Transfer Details (indicate cue type and reason):   Functional mobility during ADLs: Min guard (for ambulation)  Cognition: Cognition Overall Cognitive Status: Impaired/Different from baseline Arousal/Alertness: Awake/alert Orientation Level: Oriented X4 Attention: Focused, Sustained Focused Attention: Appears intact Sustained Attention: Appears intact Memory: Impaired Memory Impairment: Retrieval deficit, Storage deficit Awareness: Appears intact Problem Solving: Appears intact Executive Function: Reasoning Reasoning: Appears intact Safety/Judgment: Appears intact Cognition Arousal/Alertness: Awake/alert Behavior During Therapy: WFL for tasks assessed/performed Overall Cognitive Status: Impaired/Different from baseline Area of Impairment: Safety/judgement, Memory Current Attention Level: Selective Memory: Decreased short-term memory Safety/Judgement:  (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 rate 20, height _0  (1.803 m), weight 125.147 kg (275 lb 14.4 oz), SpO2 95 %. Physical Exam Constitutional: He is oriented to person, place, and time. He appears well-developed.  55 year old obese male  HENT:  Head: Normocephalic and atraumatic.  Eyes: Conjunctivae and EOM are normal.  Neck: Normal range of motion. Neck supple. No thyromegaly present.  Cardiovascular: Normal rate and regular rhythm.  Respiratory: Effort normal and breath sounds normal. No respiratory distress.  GI: Soft. Bowel sounds are normal. He exhibits no distension.  Musculoskeletal: He exhibits no edema or tenderness.  Neurological: He is alert and oriented to person, place, and time.  Follows commands.  Fair awareness of deficits Sensation intact to light touch DTRs: Hyperreflexic bilateral lower extremities Extremely mild right facial weakness Motor: 5/5 throughout Negative ataxia and dysmetria bilateral upper extremities  Skin: Skin is warm and dry.  Psychiatric: He has a normal mood and affect. His behavior is normal. Thought content normal   Results for orders placed or performed during the hospital encounter of 07/19/15 (from the past 48 hour(s))  MRSA PCR Screening     Status: None   Collection Time: 07/19/15  4:22 PM  Result Value Ref Range   MRSA by PCR NEGATIVE NEGATIVE    Comment:        The GeneXpert MRSA Assay (FDA approved for NASAL specimens only), is one component of a comprehensive MRSA colonization surveillance program. It is not intended to diagnose MRSA infection nor to guide or monitor treatment for MRSA infections.   Hemoglobin A1c     Status: None   Collection Time: 07/19/15  5:08 PM  Result Value Ref Range   Hgb A1c MFr Bld 5.3 4.8 - 5.6 %    Comment: (NOTE)         Pre-diabetes: 5.7 - 6.4         Diabetes: >6.4         Glycemic control for adults with diabetes: <7.0    Mean Plasma Glucose 105 mg/dL      Comment: (NOTE) Performed At: [facility]  [place], [place] [id number] Lindon Romp MD WP:[id number]   Lipid panel     Status: Abnormal   Collection Time: 07/19/15  5:08 PM  Result Value Ref Range   Cholesterol 193 0 - 200 mg/dL   Triglycerides 104 <150 mg/dL   HDL 36 (L) >40 mg/dL   Total CHOL/HDL Ratio 5.4 RATIO   VLDL 21 0 - 40 mg/dL   LDL Cholesterol 136 (H) 0 - 99 mg/dL    Comment:        Total Cholesterol/HDL:CHD Risk Coronary Heart Disease Risk Table                     Men   Women  1/2 Average Risk   3.4   3.3  Average Risk       5.0   4.4  2 X Average Risk   9.6   7.1  3 X Average Risk  23.4   11.0        Use the calculated Patient Ratio above and the CHD Risk Table to determine the patient's CHD Risk.        ATP III CLASSIFICATION (LDL):  <100     mg/dL   Optimal  100-129  mg/dL   Near or Above                    Optimal  130-159  mg/dL   Borderline  160-189  mg/dL   High  >190     mg/dL   Very High   Basic metabolic panel     Status: Abnormal   Collection Time: 07/21/15  2:04 AM  Result Value Ref Range   Sodium 138 135 - 145 mmol/L   Potassium 3.1 (L) 3.5 - 5.1 mmol/L   Chloride 104 101 - 111 mmol/L   CO2 23 22 - 32 mmol/L   Glucose, Bld 110 (H) 65 - 99 mg/dL   BUN 21 (H) 6 - 20 mg/dL   Creatinine, Ser 1.45 (H) 0.61 - 1.24 mg/dL   Calcium 8.4 (L) 8.9 - 10.3 mg/dL   GFR calc non Af Amer 53 (L) >60 mL/min   GFR calc Af Amer >60 >60 mL/min    Comment: (NOTE) The eGFR has been calculated using the CKD EPI equation. This calculation has not been validated in all clinical situations. eGFR's persistently <60 mL/min signify possible Chronic Kidney Disease.    Anion gap 11 5 - 15  CBC     Status: None   Collection Time: 07/21/15  2:04 AM  Result Value Ref Range   WBC 10.1 4.0 - 10.5 K/uL   RBC 4.94 4.22 - 5.81 MIL/uL   Hemoglobin 13.8 13.0 - 17.0 g/dL    Comment: REPEATED TO VERIFY DELTA CHECK NOTED    HCT 40.3 39.0 -  52.0 %   MCV 81.6 78.0 - 100.0 fL   MCH 27.9 26.0 - 34.0 pg   MCHC 34.2 30.0 - 36.0 g/dL   RDW 14.0 11.5 - 15.5 %   Platelets 165 150 - 400 K/uL   Ct Angio Head W/cm &/or Wo Cm  07/19/2015  CLINICAL DATA:  55 year old male with acute right side weakness and dizziness found have an acute left thalamic hemorrhage on noncontrast head CT. Initial encounter. EXAM: CT ANGIOGRAPHY HEAD AND NECK TECHNIQUE: Multidetector CT imaging of the head and neck was performed using the standard protocol during bolus administration of intravenous contrast. Multiplanar CT image reconstructions and MIPs  were obtained to evaluate the vascular anatomy. Carotid stenosis measurements (when applicable) are obtained utilizing NASCET criteria, using the distal internal carotid diameter as the denominator. CONTRAST:  75 mL Isovue 370 COMPARISON:  Noncontrast head CT 1347 hours today. FINDINGS: CTA NECK Skeleton: Occasional poor dentition. Lower cervical spine chronic disc and endplate degeneration. No acute osseous abnormality identified. Left maxillary sinus mucosal thickening. Other neck: Negative lung apices. No superior mediastinal lymphadenopathy. Negative thyroid, larynx, pharynx, parapharyngeal spaces, retropharyngeal space and sublingual space. No pharyngeal mass identified. Submandibular glands and parotid glands are within normal limits. Visualized orbit soft tissues are within normal limits. Left side cervical lymph nodes are within normal limits; there is a prominent left level IIa node as seen on sagittal image 179, but this has a maintained normal fatty hilum. There is a large cystic right neck mass situated at the level 2 nodal station between the right carotid space and right sternocleidomastoid muscle. This has simple fluid densitometry (10-15 Hounsfield units) and encompasses 57 x 32 x 42 mm (AP by transverse by CC). No surrounding inflammatory stranding. There does appear to be mild thickening of the wall of the lesion  in some areas (series 501, image 87). Otherwise no right side lymphadenopathy. Aortic arch: 3 vessel arch configuration. No arch atherosclerosis. No great vessel origin stenosis. Right carotid system: Mildly tortuous proximal right CCA. Soft plaque along the anterior wall of the right CCA at the larynx without stenosis. Circumferential soft plaque at the right carotid bifurcation with less than 50 % stenosis with respect to the distal vessel. Left carotid system: Negative proximal left CCA. Mild soft plaque in the medial left CCA at the level of the larynx, no stenosis. Circumferential soft plaque at the left carotid bifurcation. No stenosis. Mildly tortuous cervical left ICA. Vertebral arteries: Tortuous proximal right subclavian artery without stenosis. Soft plaque at the right vertebral artery origin resulting in moderate to severe stenosis (series 501, image 35). Otherwise negative right vertebral artery to the skullbase. No proximal left subclavian artery stenosis. Normal left vertebral artery origin. Tortuous left V1 segment. The left vertebral artery is mildly dominant and normal to the skullbase. CTA HEAD Posterior circulation: Mildly dominant distal left vertebral artery. Normal PICA vessels and vertebrobasilar junction. No distal vertebral stenosis. No basilar artery abnormality. Normal SCA and right PCA origins. There is mild irregularity and stenosis at the left PCA origin (series 503, image 109). Left posterior communicating artery is present. The right is diminutive or absent. Bilateral PCA branches are within normal limits. No CTA spot sign at the left thalamus region intra-axial hemorrhage. No evidence of associated vascular malformation. Anterior circulation: Both ICA siphons are patent with mild calcified plaque and no stenosis. Normal ophthalmic and left posterior communicating artery origins. Normal carotid termini. Normal MCA and ACA origins. The anterior communicating artery is ectatic, but  without discrete aneurysm. Bilateral ACA branches are normal aside from mild ectasia/tortuosity. Right MCA M1 segment, bifurcation, and most right MCA branches are within normal limits. There is mild to moderate irregularity and stenosis in the proximal aspect of the dominant left posterior sylvian division (series 506, image 16). Left MCA M1 segment, bifurcation, and most left MCA branches are within normal limits. There is mild irregularity and stenosis in the posterior left MCA M2/M3 branches (series 506, image 32). Venous sinuses: Patent. Anatomic variants: Mildly dominant left vertebral artery and mildly fetal type left PCA origin. IMPRESSION: 1. Negative for CTA spot sign or evidence of vascular malformation associated with the  left thalamic region hemorrhage. 2. 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 No anterior circulation large vessel stenosis despite plaque, but there is bilateral MCA M2/M3 mild to moderate irregularity and stenosis. 3. Posterior circulation remarkable for moderate to severe stenosis at the right vertebral artery origin, and mild stenosis at the left PCA origin. 4. There is a large cystic right neck mass measuring 5.7 cm. 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 ENT follow-up is necessary. Study discussed by telephone with Dr. Audria Nine on 07/19/2015 at 16:44 . Electronically Signed   By: Genevie Ann M.D.   On: 07/19/2015 16:47   Ct Angio Neck W/cm &/or Wo/cm  07/19/2015  CLINICAL DATA:  55 year old male with acute right side weakness and dizziness found have an acute left thalamic hemorrhage on noncontrast head CT. Initial encounter. EXAM: CT ANGIOGRAPHY HEAD AND NECK TECHNIQUE: Multidetector CT imaging of the head and neck was performed using the standard protocol  during bolus administration of intravenous contrast. Multiplanar CT image reconstructions and MIPs were obtained to evaluate the vascular anatomy. Carotid stenosis measurements (when applicable) are obtained utilizing NASCET criteria, using the distal internal carotid diameter as the denominator. CONTRAST:  75 mL Isovue 370 COMPARISON:  Noncontrast head CT 1347 hours today. FINDINGS: CTA NECK Skeleton: Occasional poor dentition. Lower cervical spine chronic disc and endplate degeneration. No acute osseous abnormality identified. Left maxillary sinus mucosal thickening. Other neck: Negative lung apices. No superior mediastinal lymphadenopathy. Negative thyroid, larynx, pharynx, parapharyngeal spaces, retropharyngeal space and sublingual space. No pharyngeal mass identified. Submandibular glands and parotid glands are within normal limits. Visualized orbit soft tissues are within normal limits. Left side cervical lymph nodes are within normal limits; there is a prominent left level IIa node as seen on sagittal image 179, but this has a maintained normal fatty hilum. There is a large cystic right neck mass situated at the level 2 nodal station between the right carotid space and right sternocleidomastoid muscle. This has simple fluid densitometry (10-15 Hounsfield units) and encompasses 57 x 32 x 42 mm (AP by transverse by CC). No surrounding inflammatory stranding. There does appear to be mild thickening of the wall of the lesion in some areas (series 501, image 87). Otherwise no right side lymphadenopathy. Aortic arch: 3 vessel arch configuration. No arch atherosclerosis. No great vessel origin stenosis. Right carotid system: Mildly tortuous proximal right CCA. Soft plaque along the anterior wall of the right CCA at the larynx without stenosis. Circumferential soft plaque at the right carotid bifurcation with less than 50 % stenosis with respect to the distal vessel. Left carotid system: Negative proximal left CCA.  Mild soft plaque in the medial left CCA at the level of the larynx, no stenosis. Circumferential soft plaque at the left carotid bifurcation. No stenosis. Mildly tortuous cervical left ICA. Vertebral arteries: Tortuous proximal right subclavian artery without stenosis. Soft plaque at the right vertebral artery origin resulting in moderate to severe stenosis (series 501, image 35). Otherwise negative right vertebral artery to the skullbase. No proximal left subclavian artery stenosis. Normal left vertebral artery origin. Tortuous left V1 segment. The left vertebral artery is mildly dominant and normal to the skullbase. CTA HEAD Posterior circulation: Mildly dominant distal left vertebral artery. Normal PICA vessels and vertebrobasilar junction. No distal vertebral stenosis. No basilar  artery abnormality. Normal SCA and right PCA origins. There is mild irregularity and stenosis at the left PCA origin (series 503, image 109). Left posterior communicating artery is present. The right is diminutive or absent. Bilateral PCA branches are within normal limits. No CTA spot sign at the left thalamus region intra-axial hemorrhage. No evidence of associated vascular malformation. Anterior circulation: Both ICA siphons are patent with mild calcified plaque and no stenosis. Normal ophthalmic and left posterior communicating artery origins. Normal carotid termini. Normal MCA and ACA origins. The anterior communicating artery is ectatic, but without discrete aneurysm. Bilateral ACA branches are normal aside from mild ectasia/tortuosity. Right MCA M1 segment, bifurcation, and most right MCA branches are within normal limits. There is mild to moderate irregularity and stenosis in the proximal aspect of the dominant left posterior sylvian division (series 506, image 16). Left MCA M1 segment, bifurcation, and most left MCA branches are within normal limits. There is mild irregularity and stenosis in the posterior left MCA M2/M3  branches (series 506, image 32). Venous sinuses: Patent. Anatomic variants: Mildly dominant left vertebral artery and mildly fetal type left PCA origin. IMPRESSION: 1. Negative for CTA spot sign or evidence of vascular malformation associated with the left thalamic region hemorrhage. 2. 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 No anterior circulation large vessel stenosis despite plaque, but there is bilateral MCA M2/M3 mild to moderate irregularity and stenosis. 3. Posterior circulation remarkable for moderate to severe stenosis at the right vertebral artery origin, and mild stenosis at the left PCA origin. 4. There is a large cystic right neck mass measuring 5.7 cm. 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 ENT follow-up is necessary. Study discussed by telephone with Dr. Audria Nine on 07/19/2015 at 16:44 . Electronically Signed   By: Genevie Ann M.D.   On: 07/19/2015 16:47   Ct Angio Chest/abd/pel For Dissection W And/or W/wo  07/20/2015  CLINICAL DATA:  Elevated blood pressure in the emergency department yesterday. No history of hypertension. Evaluate for aortic dissection. EXAM: CT ANGIOGRAPHY CHEST, ABDOMEN AND PELVIS TECHNIQUE: Multidetector CT imaging through the chest, abdomen and pelvis was performed using the standard protocol during bolus administration of intravenous contrast. Multiplanar reconstructed images and MIPs were obtained and reviewed to evaluate the vascular anatomy. CONTRAST:  100 ml Isovue 370 COMPARISON:  Neck CTA 07/19/2015 FINDINGS: CTA CHEST FINDINGS Mediastinum: The pulmonary arteries are well opacified with contrast. There is no evidence of acute pulmonary embolism. Pre contrast images demonstrate no displaced intimal calcifications within the aorta or great vessels.  Post-contrast, the aorta enhances normally. There is no evidence of dissection or aneurysm. The heart size is normal. There is no pericardial effusion. There is probable left ventricular hypertrophy. There are no enlarged mediastinal, hilar or axillary lymph nodes. Small hiatal hernia. Lungs/Pleura: There is no pleural effusion.The lungs are clear on the noncontrast images. There is mild dependent atelectasis on the contrasted images. Musculoskeletal/Chest wall: No chest wall lesion or acute osseous findings. Review of the MIP images confirms the above findings. CTA ABDOMEN AND PELVIS FINDINGS Hepatobiliary: The hepatic density is decreased consistent with steatosis. No focal lesions are identified post-contrast. No evidence of gallstones, gallbladder wall thickening or biliary dilatation. Pancreas: Unremarkable. No pancreatic ductal dilatation or surrounding inflammatory changes. Spleen: Normal in size without focal abnormality.  Adrenals/Urinary Tract: Both adrenal glands appear normal. Nonobstructing calculi are present in the upper and lower poles of the left kidney. There are left renal cysts measuring up to 3.2 cm in the upper pole. There is a small exophytic lesion in the lower pole the left kidney measuring 8 mm on image 185 and in indeterminate density of 45 HU. This is not imaged prior to contrast administration. The right kidney appears normal. The bladder appears normal. Stomach/Bowel: No evidence of bowel wall thickening, distention or surrounding inflammatory change. There are moderate diverticular changes within the descending and sigmoid colon. The appendix appears normal. Vascular/Lymphatic: There are no enlarged abdominal or pelvic lymph nodes. Mild aortoiliac atherosclerosis. No evidence of dissection, aneurysm or retroperitoneal hemorrhage. No evidence of large vessel occlusion. Reproductive: The prostate gland and seminal vesicles appear unremarkable. Other: Small umbilical hernia containing only  fat. Musculoskeletal: No acute or significant osseous findings. Mild lower lumbar spondylosis. Review of the MIP images confirms the above findings. IMPRESSION: 1. No acute vascular findings in the chest and abdomen or pelvis. No evidence of aortic dissection or aneurysm. Minimal atherosclerosis noted. 2. Probable left ventricular hypertrophy. 3. Left renal cysts, including a small deform it lesion in the lower pole the left kidney. 4. Hepatic steatosis and small hiatal hernia. Electronically Signed   By: Richardean Sale M.D.   On: 07/20/2015 13:49    Medical Problem List and Plan: 1.  Altered mental status and right-sided weakness secondary to left thalamic intracerebral hemorrhage secondary to hypertensive crisis 2.  DVT Prophylaxis/Anticoagulation: SCDs. Monitor for any signs of DVT 3. Pain Management: Tylenol as needed 4. Hypertension. Norvasc 10 mg daily, labetalol 100 mg 3 times a day, lisinopril 20 mg twice a day. Monitor with increased mobility 5. Neuropsych: This patient is ?capable of making decisions on his own behalf. 6. Skin/Wound Care: Routine skin checks 7. Fluids/Electrolytes/Nutrition: Routine eye and nose with follow-up chemistries  Hypokalemia: Continue to monitor 8. Hyperlipidemia. Lipitor 9. Mood/anxiety. Xanax 0.5 mg 3 times daily as needed. 10. Probable sleep apnea. Plan outpatient evaluation 11. Morbid obesity. Body mass index 38.5 kg. Dietary follow-up 12. Presumed OSA: Continue CPAP 13. AKI: Avoid nephrotoxic meds  Post Admission Physician Evaluation: 1. 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 2. Patient is admitted to receive collaborative, interdisciplinary care between the physiatrist, rehab nursing staff, and therapy team. 3. Patient's level of medical complexity and substantial therapy needs in context of that medical necessity cannot be provided at a lesser intensity of care such as a  SNF. 4. Patient has experienced substantial functional loss from his/her baseline which was documented above under the "Functional History" and "Functional Status" headings.  Judging by the patient's diagnosis, physical exam, and functional history, the patient has potential for functional progress which will result in measurable gains while on inpatient rehab.  These gains will be of substantial and practical use upon discharge  in facilitating mobility and self-care at the household level. 5. Physiatrist will provide 24 hour management of medical needs as well as oversight of the therapy plan/treatment and provide guidance as appropriate regarding the interaction of the two. 6. 24 hour rehab nursing will assist with safety, disease management and patient education and help integrate therapy concepts, techniques,education, etc. 7. PT will assess and treat for/with: Lower extremity strength, range of motion, stamina, balance, functional mobility, safety, adaptive techniques and equipment, coping skills, pain control, stroke education.   Goals are: Mod I/Supervision. 8. OT will  assess and treat for/with: ADL's, functional mobility, safety, upper extremity strength, adaptive techniques and equipment, ego support, and community reintegration.   Goals are: Mod I. Therapy may proceed with showering this patient. 9. SLP will assess and treat for/with: speech, High-level cognition.  Goals are: Mod I/Ind. 10. Case Management and Social Worker will assess and treat for psychological issues and discharge planning. 11. Team conference will be held weekly to assess progress toward goals and to determine barriers to discharge. 12. Patient will receive at least 3 hours of therapy per day at least 5 days per week. 13. ELOS: 5-8 days.     14. Prognosis:  excellent  Delice Lesch, MD 07/21/2015

## 2015-07-21 NOTE — Evaluation (Addendum)
Occupational Therapy Evaluation Patient Details Name: Glenn Liu MRN: [id number] DOB: [birth year] Today's Date: 07/21/2015    History of Present Illness 55 y.o. male with no pertinant PMH per chart, however pt reports previous injury to left LE, who presented to ED with AMS and R side weakness which lasted around 15-20 minutes. CT Head 4/23 acute left thalamic hemorrhage   Clinical Impression   Pt admitted with above. Pt independent with ADLs, PTA. Feel pt will benefit from acute OT to increase independence prior to d/c. Pt did not know fire department's number when asked. Recommending CIR consult at this time.    Follow Up Recommendations  CIR    Equipment Recommendations  Other (comment) (defer to next venue)    Recommendations for Other Services       Precautions / Restrictions Precautions Precautions: Fall Restrictions Weight Bearing Restrictions: No      Mobility Bed Mobility General bed mobility comments: not assessed-pt in chair  Transfers Overall transfer level: Needs assistance Transfers: Sit to/from Stand Sit to Stand: Supervision            Balance Min guard given for ambulation; Unsteady when simulating functional task with single leg stance without UE support.                   ADL Overall ADL's : Needs assistance/impaired             Lower Body Bathing: Min guard (standing with UE supported)       Lower Body Dressing: Set up;Supervision/safety;Sit to/from stand   Toilet Transfer: Ambulation;Min guard (sit to stand from chair) Toilet Transfer Details (indicate cue type and reason):           Functional mobility during ADLs: Min guard (for ambulation)       Vision Pt wears glasses Vision Assessment?: Yes Visual Fields: No apparent deficits   Perception     Praxis      Pertinent Vitals/Pain Pain Assessment: No/denies pain     Hand Dominance Right   Extremity/Trunk Assessment Upper Extremity Assessment Upper  Extremity Assessment: RUE deficits/detail RUE Deficits / Details:  (pt reports right arm feels weaker but OT unable to fully break when performing MMT of shoulder flexor strength) RUE Coordination: decreased gross motor   Lower Extremity Assessment Lower Extremity Assessment: Defer to PT evaluation       Communication Communication Communication: No difficulties   Cognition Arousal/Alertness: Awake/alert Behavior During Therapy: WFL for tasks assessed/performed Overall Cognitive Status: Impaired/Different from baseline Area of Impairment: Safety/judgement;Memory   Memory: Decreased short-term memory   Safety/Judgement:  (did not know number of fire department)   General Comments       Exercises       Shoulder Instructions      Home Living Family/patient expects to be discharged to:: Inpatient rehab Living Arrangements: Spouse/significant other;Children Available Help at Discharge: Family Type of Home: House Home Access: Level entry     Home Layout: One level     Bathroom Shower/Tub: Walk-in shower;Tub/shower unit   Bathroom Toilet: Standard     Home Equipment: None          Prior Functioning/Environment Level of Independence: Independent   Comments: Reports left leg injury affects him playing sports            OT Diagnosis: Cognitive deficits   OT Problem List: Impaired balance (sitting and/or standing);Decreased coordination;Decreased cognition;Decreased safety awareness;Decreased knowledge of use of DME or AE   OT Treatment/Interventions: Self-care/ADL training;DME and/or  AE instruction;Therapeutic activities;Cognitive remediation/compensation;Balance training;Patient/family education    OT Goals(Current goals can be found in the care plan section) Acute Rehab OT Goals Patient Stated Goal: not stated OT Goal Formulation: With patient Time For Goal Achievement: 07/28/15 Potential to Achieve Goals: Good  OT Frequency: Min 2X/week   Barriers to  D/C:            Co-evaluation              End of Session Equipment Utilized During Treatment: Gait belt  Activity Tolerance: Patient tolerated treatment well Patient left: in chair;with call bell/phone within reach;with family/visitor present   Time: [id number] OT Time Calculation (min): 16 min Charges:  OT General Charges $OT Visit: 1 Procedure OT Evaluation $OT Eval Moderate Complexity: 1 Procedure G-CodesBenito Mccreedy OTR/L [id number] 07/21/2015, 1:20 PM

## 2015-07-21 NOTE — Progress Notes (Signed)
I met with pt, his wife, and son at bedside. We discussed a possible inpt rehab admission pendiing insurance approval. Pt would like to think about his options overnight and give me his decision in the morning. His wife can take FMLA to provide him 24/7 supervision at d/c. I gave son and fmaily friend a tour of the unit. I will follow up in the morning. [phone]

## 2015-07-21 NOTE — Progress Notes (Signed)
Pt transferred to [id number] with no complications. A&O x 4, NIH 0, BP 119/52 HR 69, O2 98%. Belongings sent with patient to [facility] include multiple books, eye glasses, cell phone + charger, kindle + charger, and clothing. 27M nurse at bedside and updated.

## 2015-07-21 NOTE — Progress Notes (Signed)
Patient refused CPAP for the night  

## 2015-07-21 NOTE — Consult Note (Signed)
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 right handed male with unremarkable past medical history. Lives with spouse independent prior to admission. One level home with level entry. Patient is employed as a Environmental education officer. Wife works day shift. 3 children in the area also work. 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 Systolic blood pressures 123456.CT of the head showed acute left thalamic hemorrhage 2.0 x 1.8 cm. Placed on a Cardene drip for blood pressure control. CTA of head and neck negative for vascular malformation. No anterior circulation large vessel stenosis. 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 Echocardiogram with ejection fraction 123456 grade 1 diastolic dysfunction. Tolerating a regular diet. Presumed OSA with pulmonary service follow-up trial CPAP and follow-up sleep evaluation as outpatient. Physical therapy evaluation completed with recommendations of physical medicine rehabilitation consult.  Review of Systems  Constitutional: Negative for fever and chills.  HENT: Negative for hearing loss.        Occasional headache  Eyes: Negative for blurred vision and double vision.  Respiratory: Negative for cough and shortness of breath.   Cardiovascular: Negative for chest pain and palpitations.  Gastrointestinal: Positive for constipation. Negative for nausea and vomiting.  Genitourinary: Negative for dysuria and hematuria.  Musculoskeletal: Positive for myalgias and joint pain.  Skin: Negative for rash.  Neurological: Negative for  seizures.  All other systems reviewed and are negative.  History reviewed. No pertinent past medical history. History reviewed. No pertinent past surgical history. Family History  Problem Relation Age of Onset  . Heart attack Mother    Social History:  reports that he has never smoked. He does not have any smokeless tobacco history on file. His alcohol and drug histories are not on file. Allergies: No Known Allergies Medications Prior to Admission  Medication Sig Dispense Refill  . naphazoline-glycerin (CLEAR EYES) 0.012-0.2 % SOLN Place 1-2 drops into both eyes daily as needed for irritation.      Home: Home Living Family/patient expects to be discharged to:: Private residence Living Arrangements: Spouse/significant other, Children Available Help at Discharge: Family Type of Home: House Home Access: Level entry [place]: One level Bathroom Shower/Tub: Tub/shower unit Home Equipment: None  Functional History: Prior Function Level of Independence: Independent Functional Status:  Mobility: Bed Mobility Overal bed mobility: Needs Assistance Bed Mobility: Sit to Supine Sit to supine: Min assist General bed mobility comments: min assist to elevate LEs to bed and reposition Transfers Overall transfer level: Needs assistance Equipment used: 1 person hand held assist Transfers: Sit to/from Stand Sit to Stand: Min assist General transfer comment: min assist to come to standing, posterior LOB noted and instability in static standing upon reaching upright Ambulation/Gait Ambulation/Gait assistance: Mod assist Ambulation Distance (Feet): 60 Feet Assistive device: 1 person hand held assist Gait Pattern/deviations: Shuffle, Ataxic, Staggering right, Staggering left, Wide base of support General Gait Details: patient with poorly coordinated gait, multiple LOB noted. Patient required increased physical assist to maintain stability. Easily fatigues with limited mobility (cues to  improve cadence) Gait velocity: decreased Gait velocity interpretation: Below normal speed for age/gender    ADL:    Cognition: Cognition  Overall Cognitive Status: Impaired/Different from baseline Arousal/Alertness: Awake/alert Orientation Level: Oriented X4 Attention: Focused, Sustained Focused Attention: Appears intact Sustained Attention: Appears intact Memory: Impaired Memory Impairment: Retrieval deficit, Storage deficit Awareness: Appears intact Problem Solving: Appears intact Executive Function: Reasoning Reasoning: Appears intact Safety/Judgment: Appears intact Cognition Arousal/Alertness: Awake/alert Behavior During Therapy: Impulsive Overall Cognitive Status: Impaired/Different from baseline Area of Impairment: Attention, Safety/judgement, Awareness, Problem solving Current Attention Level: Selective Safety/Judgement: Decreased awareness of safety, Decreased awareness of deficits Awareness: Emergent Problem Solving: Requires verbal cues, Requires tactile cues  Blood pressure 165/80, pulse 70, temperature 98 F (36.7 C), temperature source Oral, resp. rate 16, height 5\' 11"  (1.803 m), weight 125.147 kg (275 lb 14.4 oz), SpO2 98 %. Physical Exam  Vitals reviewed. Constitutional: He is oriented to person, place, and time. He appears well-developed.  55 year old obese male  HENT:  Head: Normocephalic and atraumatic.  Eyes: Conjunctivae and EOM are normal.  Neck: Normal range of motion. Neck supple. No thyromegaly present.  Cardiovascular: Normal rate and regular rhythm.   Respiratory: Effort normal and breath sounds normal. No respiratory distress.  GI: Soft. Bowel sounds are normal. He exhibits no distension.  Musculoskeletal: He exhibits no edema or tenderness.  Neurological: He is alert and oriented to person, place, and time.  Follows commands.  Fair awareness of deficits Sensation intact to light touch DTRs: Hyperreflexic bilateral lower  extremities Extremely mild right facial weakness Motor: 5/5 throughout Negative ataxia and dysmetria bilateral upper extremities  Skin: Skin is warm and dry.  Psychiatric: He has a normal mood and affect. His behavior is normal. Thought content normal.   Results for orders placed or performed during the hospital encounter of 07/19/15 (from the past 24 hour(s))  Basic metabolic panel     Status: Abnormal   Collection Time: 07/21/15  2:04 AM  Result Value Ref Range   Sodium 138 135 - 145 mmol/L   Potassium 3.1 (L) 3.5 - 5.1 mmol/L   Chloride 104 101 - 111 mmol/L   CO2 23 22 - 32 mmol/L   Glucose, Bld 110 (H) 65 - 99 mg/dL   BUN 21 (H) 6 - 20 mg/dL   Creatinine, Ser 1.45 (H) 0.61 - 1.24 mg/dL   Calcium 8.4 (L) 8.9 - 10.3 mg/dL   GFR calc non Af Amer 53 (L) >60 mL/min   GFR calc Af Amer >60 >60 mL/min   Anion gap 11 5 - 15  CBC     Status: None   Collection Time: 07/21/15  2:04 AM  Result Value Ref Range   WBC 10.1 4.0 - 10.5 K/uL   RBC 4.94 4.22 - 5.81 MIL/uL   Hemoglobin 13.8 13.0 - 17.0 g/dL   HCT 40.3 39.0 - 52.0 %   MCV 81.6 78.0 - 100.0 fL   MCH 27.9 26.0 - 34.0 pg   MCHC 34.2 30.0 - 36.0 g/dL   RDW 14.0 11.5 - 15.5 %   Platelets 165 150 - 400 K/uL   Ct Angio Head W/cm &/or Wo Cm  07/19/2015  CLINICAL DATA:  55 year old male with acute right side weakness and dizziness found have an acute left thalamic hemorrhage on noncontrast head CT. Initial encounter. EXAM: CT ANGIOGRAPHY HEAD AND NECK TECHNIQUE: Multidetector CT imaging of the head and neck was performed using the standard protocol during bolus administration of intravenous contrast. Multiplanar CT image reconstructions and MIPs were obtained to evaluate the vascular anatomy. Carotid stenosis measurements (when applicable) are obtained utilizing NASCET criteria, using the distal  internal carotid diameter as the denominator. CONTRAST:  75 mL Isovue 370 COMPARISON:  Noncontrast head CT 1347 hours today. FINDINGS: CTA NECK  Skeleton: Occasional poor dentition. Lower cervical spine chronic disc and endplate degeneration. No acute osseous abnormality identified. Left maxillary sinus mucosal thickening. Other neck: Negative lung apices. No superior mediastinal lymphadenopathy. Negative thyroid, larynx, pharynx, parapharyngeal spaces, retropharyngeal space and sublingual space. No pharyngeal mass identified. Submandibular glands and parotid glands are within normal limits. Visualized orbit soft tissues are within normal limits. Left side cervical lymph nodes are within normal limits; there is a prominent left level IIa node as seen on sagittal image 179, but this has a maintained normal fatty hilum. There is a large cystic right neck mass situated at the level 2 nodal station between the right carotid space and right sternocleidomastoid muscle. This has simple fluid densitometry (10-15 Hounsfield units) and encompasses 57 x 32 x 42 mm (AP by transverse by CC). No surrounding inflammatory stranding. There does appear to be mild thickening of the wall of the lesion in some areas (series 501, image 87). Otherwise no right side lymphadenopathy. Aortic arch: 3 vessel arch configuration. No arch atherosclerosis. No great vessel origin stenosis. Right carotid system: Mildly tortuous proximal right CCA. Soft plaque along the anterior wall of the right CCA at the larynx without stenosis. Circumferential soft plaque at the right carotid bifurcation with less than 50 % stenosis with respect to the distal vessel. Left carotid system: Negative proximal left CCA. Mild soft plaque in the medial left CCA at the level of the larynx, no stenosis. Circumferential soft plaque at the left carotid bifurcation. No stenosis. Mildly tortuous cervical left ICA. Vertebral arteries: Tortuous proximal right subclavian artery without stenosis. Soft plaque at the right vertebral artery origin resulting in moderate to severe stenosis (series 501, image 35). Otherwise  negative right vertebral artery to the skullbase. No proximal left subclavian artery stenosis. Normal left vertebral artery origin. Tortuous left V1 segment. The left vertebral artery is mildly dominant and normal to the skullbase. CTA HEAD Posterior circulation: Mildly dominant distal left vertebral artery. Normal PICA vessels and vertebrobasilar junction. No distal vertebral stenosis. No basilar artery abnormality. Normal SCA and right PCA origins. There is mild irregularity and stenosis at the left PCA origin (series 503, image 109). Left posterior communicating artery is present. The right is diminutive or absent. Bilateral PCA branches are within normal limits. No CTA spot sign at the left thalamus region intra-axial hemorrhage. No evidence of associated vascular malformation. Anterior circulation: Both ICA siphons are patent with mild calcified plaque and no stenosis. Normal ophthalmic and left posterior communicating artery origins. Normal carotid termini. Normal MCA and ACA origins. The anterior communicating artery is ectatic, but without discrete aneurysm. Bilateral ACA branches are normal aside from mild ectasia/tortuosity. Right MCA M1 segment, bifurcation, and most right MCA branches are within normal limits. There is mild to moderate irregularity and stenosis in the proximal aspect of the dominant left posterior sylvian division (series 506, image 16). Left MCA M1 segment, bifurcation, and most left MCA branches are within normal limits. There is mild irregularity and stenosis in the posterior left MCA M2/M3 branches (series 506, image 32). Venous sinuses: Patent. Anatomic variants: Mildly dominant left vertebral artery and mildly fetal type left PCA origin. IMPRESSION: 1. Negative for CTA spot sign or evidence of vascular malformation associated with the left thalamic region hemorrhage. 2. 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 No anterior circulation large vessel stenosis  despite plaque, but there is bilateral MCA M2/M3 mild to moderate irregularity and stenosis. 3. Posterior circulation remarkable for moderate to severe stenosis at the right vertebral artery origin, and mild stenosis at the left PCA origin. 4. There is a large cystic right neck mass measuring 5.7 cm. 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 ENT follow-up is necessary. Study discussed by telephone with Dr. Audria Nine on 07/19/2015 at 16:44 . Electronically Signed   By: Genevie Ann M.D.   On: 07/19/2015 16:47   Ct Head Wo Contrast  07/19/2015  CLINICAL DATA:  Right side weakness and dizziness. Initial encounter. EXAM: CT HEAD WITHOUT CONTRAST TECHNIQUE: Contiguous axial images were obtained from the base of the skull through the vertex without intravenous contrast. COMPARISON:  None. FINDINGS: The patient has an acute hemorrhage in the left thalamus measuring approximately 2.0 x 1.8 cm in the axial plane x 2 cm craniocaudal for total volume of 3.6 cc. Also seen is some chronic microvascular ischemic change. No mass lesion, midline shift, hydrocephalus, subarachnoid or subdural hemorrhage is identified. Mucosal thickening in the maxillary sinuses is worse on the left. The calvarium is intact. IMPRESSION: Acute left thalamic hemorrhage as described above. Volume is estimated at 3.6 cc. These results were called by telephone at the time of interpretation on 07/19/2015 at 2:00 pm to Dr. Silverio Decamp, who verbally acknowledged these results. Electronically Signed   By: Inge Rise M.D.   On: 07/19/2015 14:01   Ct Angio Neck W/cm &/or Wo/cm  07/19/2015  CLINICAL DATA:  55 year old male with acute right side weakness and dizziness found have an acute left thalamic hemorrhage on noncontrast head CT. Initial encounter. EXAM: CT  ANGIOGRAPHY HEAD AND NECK TECHNIQUE: Multidetector CT imaging of the head and neck was performed using the standard protocol during bolus administration of intravenous contrast. Multiplanar CT image reconstructions and MIPs were obtained to evaluate the vascular anatomy. Carotid stenosis measurements (when applicable) are obtained utilizing NASCET criteria, using the distal internal carotid diameter as the denominator. CONTRAST:  75 mL Isovue 370 COMPARISON:  Noncontrast head CT 1347 hours today. FINDINGS: CTA NECK Skeleton: Occasional poor dentition. Lower cervical spine chronic disc and endplate degeneration. No acute osseous abnormality identified. Left maxillary sinus mucosal thickening. Other neck: Negative lung apices. No superior mediastinal lymphadenopathy. Negative thyroid, larynx, pharynx, parapharyngeal spaces, retropharyngeal space and sublingual space. No pharyngeal mass identified. Submandibular glands and parotid glands are within normal limits. Visualized orbit soft tissues are within normal limits. Left side cervical lymph nodes are within normal limits; there is a prominent left level IIa node as seen on sagittal image 179, but this has a maintained normal fatty hilum. There is a large cystic right neck mass situated at the level 2 nodal station between the right carotid space and right sternocleidomastoid muscle. This has simple fluid densitometry (10-15 Hounsfield units) and encompasses 57 x 32 x 42 mm (AP by transverse by CC). No surrounding inflammatory stranding. There does appear to be mild thickening of the wall of the lesion in some areas (series 501, image 87). Otherwise no right side lymphadenopathy. Aortic arch: 3 vessel arch configuration. No arch atherosclerosis. No great vessel origin stenosis. Right carotid system: Mildly tortuous proximal right CCA. Soft plaque along the anterior wall of the right CCA at the larynx without stenosis.  Circumferential soft plaque at the right carotid  bifurcation with less than 50 % stenosis with respect to the distal vessel. Left carotid system: Negative proximal left CCA. Mild soft plaque in the medial left CCA at the level of the larynx, no stenosis. Circumferential soft plaque at the left carotid bifurcation. No stenosis. Mildly tortuous cervical left ICA. Vertebral arteries: Tortuous proximal right subclavian artery without stenosis. Soft plaque at the right vertebral artery origin resulting in moderate to severe stenosis (series 501, image 35). Otherwise negative right vertebral artery to the skullbase. No proximal left subclavian artery stenosis. Normal left vertebral artery origin. Tortuous left V1 segment. The left vertebral artery is mildly dominant and normal to the skullbase. CTA HEAD Posterior circulation: Mildly dominant distal left vertebral artery. Normal PICA vessels and vertebrobasilar junction. No distal vertebral stenosis. No basilar artery abnormality. Normal SCA and right PCA origins. There is mild irregularity and stenosis at the left PCA origin (series 503, image 109). Left posterior communicating artery is present. The right is diminutive or absent. Bilateral PCA branches are within normal limits. No CTA spot sign at the left thalamus region intra-axial hemorrhage. No evidence of associated vascular malformation. Anterior circulation: Both ICA siphons are patent with mild calcified plaque and no stenosis. Normal ophthalmic and left posterior communicating artery origins. Normal carotid termini. Normal MCA and ACA origins. The anterior communicating artery is ectatic, but without discrete aneurysm. Bilateral ACA branches are normal aside from mild ectasia/tortuosity. Right MCA M1 segment, bifurcation, and most right MCA branches are within normal limits. There is mild to moderate irregularity and stenosis in the proximal aspect of the dominant left posterior sylvian division (series 506, image 16). Left MCA M1 segment, bifurcation, and  most left MCA branches are within normal limits. There is mild irregularity and stenosis in the posterior left MCA M2/M3 branches (series 506, image 32). Venous sinuses: Patent. Anatomic variants: Mildly dominant left vertebral artery and mildly fetal type left PCA origin. IMPRESSION: 1. Negative for CTA spot sign or evidence of vascular malformation associated with the left thalamic region hemorrhage. 2. 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 No anterior circulation large vessel stenosis despite plaque, but there is bilateral MCA M2/M3 mild to moderate irregularity and stenosis. 3. Posterior circulation remarkable for moderate to severe stenosis at the right vertebral artery origin, and mild stenosis at the left PCA origin. 4. There is a large cystic right neck mass measuring 5.7 cm. 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 ENT follow-up is necessary. Study discussed by telephone with Dr. Audria Nine on 07/19/2015 at 16:44 . Electronically Signed   By: Genevie Ann M.D.   On: 07/19/2015 16:47   Ct Angio Chest/abd/pel For Dissection W And/or W/wo  07/20/2015  CLINICAL DATA:  Elevated blood pressure in the emergency department yesterday. No history of hypertension. Evaluate for aortic dissection. EXAM: CT ANGIOGRAPHY CHEST, ABDOMEN AND PELVIS TECHNIQUE: Multidetector CT imaging through the chest, abdomen and pelvis was performed using the standard protocol during bolus administration of intravenous contrast. Multiplanar reconstructed images and MIPs were obtained and reviewed to evaluate the vascular anatomy. CONTRAST:  100 ml Isovue 370 COMPARISON:  Neck CTA 07/19/2015 FINDINGS: CTA CHEST FINDINGS Mediastinum: The pulmonary arteries are well opacified with contrast. There is no evidence of acute  pulmonary embolism. Pre  contrast images demonstrate no displaced intimal calcifications within the aorta or great vessels. Post-contrast, the aorta enhances normally. There is no evidence of dissection or aneurysm. The heart size is normal. There is no pericardial effusion. There is probable left ventricular hypertrophy. There are no enlarged mediastinal, hilar or axillary lymph nodes. Small hiatal hernia. Lungs/Pleura: There is no pleural effusion.The lungs are clear on the noncontrast images. There is mild dependent atelectasis on the contrasted images. Musculoskeletal/Chest wall: No chest wall lesion or acute osseous findings. Review of the MIP images confirms the above findings. CTA ABDOMEN AND PELVIS FINDINGS Hepatobiliary: The hepatic density is decreased consistent with steatosis. No focal lesions are identified post-contrast. No evidence of gallstones, gallbladder wall thickening or biliary dilatation. Pancreas: Unremarkable. No pancreatic ductal dilatation or surrounding inflammatory changes. Spleen: Normal in size without focal abnormality. Adrenals/Urinary Tract: Both adrenal glands appear normal. Nonobstructing calculi are present in the upper and lower poles of the left kidney. There are left renal cysts measuring up to 3.2 cm in the upper pole. There is a small exophytic lesion in the lower pole the left kidney measuring 8 mm on image 185 and in indeterminate density of 45 HU. This is not imaged prior to contrast administration. The right kidney appears normal. The bladder appears normal. Stomach/Bowel: No evidence of bowel wall thickening, distention or surrounding inflammatory change. There are moderate diverticular changes within the descending and sigmoid colon. The appendix appears normal. Vascular/Lymphatic: There are no enlarged abdominal or pelvic lymph nodes. Mild aortoiliac atherosclerosis. No evidence of dissection, aneurysm or retroperitoneal hemorrhage. No evidence of large vessel  occlusion. Reproductive: The prostate gland and seminal vesicles appear unremarkable. Other: Small umbilical hernia containing only fat. Musculoskeletal: No acute or significant osseous findings. Mild lower lumbar spondylosis. Review of the MIP images confirms the above findings. IMPRESSION: 1. No acute vascular findings in the chest and abdomen or pelvis. No evidence of aortic dissection or aneurysm. Minimal atherosclerosis noted. 2. Probable left ventricular hypertrophy. 3. Left renal cysts, including a small deform it lesion in the lower pole the left kidney. 4. Hepatic steatosis and small hiatal hernia. Electronically Signed   By: Richardean Sale M.D.   On: 07/20/2015 13:49   Assessment/Plan: Diagnosis: Left thalamic intracerebral hemorrhage Labs and images independently reviewed.  Records reviewed and summated above. Stroke Continue secondary stroke prophylaxis and Risk Factor Modification listed below:   Antiplatelet therapy   Blood Pressure Management:  Continue current medication with prn's with permisive HTN per primary team Statin Agent  1. Does the need for close, 24 hr/day medical supervision in concert with the patient's rehab needs make it unreasonable for this patient to be served in a less intensive setting? Potentially  2. Co-Morbidities requiring supervision/potential complications: Presumed OSA (continue CPAP, monitor for daytime somnolence and energy), hypokalemia (continue to monitor, replete as necessary), AKI (avoid nephrotoxic meds) 3. Due to safety, disease management and patient education, does the patient require 24 hr/day rehab nursing? Potentially 4. Does the patient require coordinated care of a physician, rehab nurse, PT (1-2 hrs/day, 5 days/week) and OT (1-2 hrs/day, 5 days/week) to address physical and functional deficits in the context of the above medical diagnosis(es)? Potentially Addressing deficits in the following areas: balance, endurance, locomotion,  transferring, toileting and psychosocial support 5. Can the patient actively participate in an intensive therapy program of at least 3 hrs of therapy per day at least 5 days per week? Yes 6. The potential for patient to make measurable gains while on  inpatient rehab is excellent 7. Anticipated functional outcomes upon discharge from inpatient rehab are modified independent and supervision  with PT, modified independent with OT, n/a with SLP. 8. Estimated rehab length of stay to reach the above functional goals is: 7-10 days. 9. Does the patient have adequate social supports and living environment to accommodate these discharge functional goals? Yes 10. Anticipated D/C setting: Home 11. Anticipated post D/C treatments: HH therapy and Home excercise program 12. Overall Rehab/Functional Prognosis: good  RECOMMENDATIONS: This patient's condition is appropriate for continued rehabilitative care in the following setting: Would appreciate reevaluation from therapies. If patient continues to have stated deficits, would recommend CIR. Patient has agreed to participate in recommended program. Yes Note that insurance prior authorization may be required for reimbursement for recommended care.  Comment: Rehab Admissions Coordinator to follow up.  Delice Lesch, MD 07/21/2015

## 2015-07-22 ENCOUNTER — Inpatient Hospital Stay (HOSPITAL_COMMUNITY)
Admission: RE | Admit: 2015-07-22 | Discharge: 2015-07-27 | DRG: 057 | Disposition: A | Discharge status: Home / Self Care | Payer: 59 | Source: Intra-hospital | Attending: Physical Medicine & Rehabilitation | Admitting: Physical Medicine & Rehabilitation

## 2015-07-22 DIAGNOSIS — E785 Hyperlipidemia, unspecified: Secondary | ICD-10-CM | POA: Diagnosis present

## 2015-07-22 DIAGNOSIS — I610 Nontraumatic intracerebral hemorrhage in hemisphere, subcortical: Secondary | ICD-10-CM | POA: Diagnosis not present

## 2015-07-22 DIAGNOSIS — E876 Hypokalemia: Secondary | ICD-10-CM | POA: Diagnosis present

## 2015-07-22 DIAGNOSIS — R269 Unspecified abnormalities of gait and mobility: Secondary | ICD-10-CM

## 2015-07-22 DIAGNOSIS — F411 Generalized anxiety disorder: Secondary | ICD-10-CM | POA: Diagnosis not present

## 2015-07-22 DIAGNOSIS — E8809 Other disorders of plasma-protein metabolism, not elsewhere classified: Secondary | ICD-10-CM | POA: Diagnosis not present

## 2015-07-22 DIAGNOSIS — G4733 Obstructive sleep apnea (adult) (pediatric): Secondary | ICD-10-CM | POA: Insufficient documentation

## 2015-07-22 DIAGNOSIS — F419 Anxiety disorder, unspecified: Secondary | ICD-10-CM | POA: Diagnosis present

## 2015-07-22 DIAGNOSIS — Z6837 Body mass index (BMI) 37.0-37.9, adult: Secondary | ICD-10-CM | POA: Diagnosis not present

## 2015-07-22 DIAGNOSIS — K5900 Constipation, unspecified: Secondary | ICD-10-CM | POA: Diagnosis present

## 2015-07-22 DIAGNOSIS — Z6838 Body mass index (BMI) 38.0-38.9, adult: Secondary | ICD-10-CM | POA: Diagnosis not present

## 2015-07-22 DIAGNOSIS — I10 Essential (primary) hypertension: Secondary | ICD-10-CM | POA: Diagnosis present

## 2015-07-22 DIAGNOSIS — R531 Weakness: Secondary | ICD-10-CM | POA: Diagnosis present

## 2015-07-22 DIAGNOSIS — I69151 Hemiplegia and hemiparesis following nontraumatic intracerebral hemorrhage affecting right dominant side: Secondary | ICD-10-CM | POA: Diagnosis not present

## 2015-07-22 DIAGNOSIS — I619 Nontraumatic intracerebral hemorrhage, unspecified: Secondary | ICD-10-CM | POA: Diagnosis present

## 2015-07-22 DIAGNOSIS — M791 Myalgia: Secondary | ICD-10-CM | POA: Diagnosis present

## 2015-07-22 DIAGNOSIS — Z79899 Other long term (current) drug therapy: Secondary | ICD-10-CM

## 2015-07-22 DIAGNOSIS — I69393 Ataxia following cerebral infarction: Secondary | ICD-10-CM | POA: Insufficient documentation

## 2015-07-22 DIAGNOSIS — N179 Acute kidney failure, unspecified: Secondary | ICD-10-CM

## 2015-07-22 DIAGNOSIS — I161 Hypertensive emergency: Secondary | ICD-10-CM | POA: Diagnosis present

## 2015-07-22 DIAGNOSIS — I69398 Other sequelae of cerebral infarction: Secondary | ICD-10-CM

## 2015-07-22 DIAGNOSIS — E669 Obesity, unspecified: Secondary | ICD-10-CM | POA: Diagnosis present

## 2015-07-22 LAB — CBC
HEMATOCRIT: 41.1 % (ref 39.0–52.0)
Hemoglobin: 13.8 g/dL (ref 13.0–17.0)
MCH: 27.7 pg (ref 26.0–34.0)
MCHC: 33.6 g/dL (ref 30.0–36.0)
MCV: 82.5 fL (ref 78.0–100.0)
PLATELETS: 165 10*3/uL (ref 150–400)
RBC: 4.98 MIL/uL (ref 4.22–5.81)
RDW: 13.7 % (ref 11.5–15.5)
WBC: 9.8 10*3/uL (ref 4.0–10.5)

## 2015-07-22 LAB — BASIC METABOLIC PANEL
Anion gap: 11 (ref 5–15)
BUN: 17 mg/dL (ref 6–20)
CHLORIDE: 104 mmol/L (ref 101–111)
CO2: 25 mmol/L (ref 22–32)
Calcium: 8.6 mg/dL — ABNORMAL LOW (ref 8.9–10.3)
Creatinine, Ser: 1.43 mg/dL — ABNORMAL HIGH (ref 0.61–1.24)
GFR calc non Af Amer: 54 mL/min — ABNORMAL LOW (ref >60)
Glucose, Bld: 96 mg/dL (ref 65–99)
POTASSIUM: 3.4 mmol/L — AB (ref 3.5–5.1)
SODIUM: 140 mmol/L (ref 135–145)

## 2015-07-22 MED ORDER — ONDANSETRON HCL 4 MG PO TABS
4.0000 mg | ORAL_TABLET | Freq: Four times a day (QID) | ORAL | Status: DC | PRN
Start: 2015-07-22 — End: 2015-07-27

## 2015-07-22 MED ORDER — AMLODIPINE BESYLATE 10 MG PO TABS
10.0000 mg | ORAL_TABLET | Freq: Every day | ORAL | Status: DC
  Administered 2015-07-23 – 2015-07-27 (×5): 10 mg via ORAL
  Filled 2015-07-22 (×5): qty 1

## 2015-07-22 MED ORDER — LABETALOL HCL 200 MG PO TABS
200.0000 mg | ORAL_TABLET | Freq: Three times a day (TID) | ORAL | Status: DC
  Administered 2015-07-22 – 2015-07-27 (×14): 200 mg via ORAL
  Filled 2015-07-22 (×2): qty 1
  Filled 2015-07-22: qty 2
  Filled 2015-07-22 (×2): qty 1
  Filled 2015-07-22: qty 2
  Filled 2015-07-22 (×8): qty 1

## 2015-07-22 MED ORDER — ACETAMINOPHEN 325 MG PO TABS: 325.0000 mg | ORAL_TABLET | ORAL | Status: DC | PRN

## 2015-07-22 MED ORDER — BISACODYL 10 MG RE SUPP
10.0000 mg | Freq: Every day | RECTAL | Status: DC | PRN
  Administered 2015-07-22: 10 mg via RECTAL
  Filled 2015-07-22: qty 1

## 2015-07-22 MED ORDER — ONDANSETRON HCL 4 MG/2ML IJ SOLN: 4.0000 mg | Freq: Four times a day (QID) | INTRAMUSCULAR | Status: DC | PRN

## 2015-07-22 MED ORDER — PANTOPRAZOLE SODIUM 40 MG PO TBEC
40.0000 mg | DELAYED_RELEASE_TABLET | Freq: Every day | ORAL | Status: DC
  Administered 2015-07-23 – 2015-07-27 (×5): 40 mg via ORAL
  Filled 2015-07-22 (×5): qty 1

## 2015-07-22 MED ORDER — LISINOPRIL 20 MG PO TABS
20.0000 mg | ORAL_TABLET | Freq: Two times a day (BID) | ORAL | Status: DC
  Administered 2015-07-22 – 2015-07-27 (×10): 20 mg via ORAL
  Filled 2015-07-22 (×10): qty 1

## 2015-07-22 MED ORDER — SENNOSIDES-DOCUSATE SODIUM 8.6-50 MG PO TABS
1.0000 | ORAL_TABLET | Freq: Two times a day (BID) | ORAL | Status: DC
  Administered 2015-07-22 – 2015-07-26 (×4): 1 via ORAL
  Filled 2015-07-22 (×6): qty 1

## 2015-07-22 MED ORDER — ACETAMINOPHEN 650 MG RE SUPP: 650.0000 mg | RECTAL | Status: DC | PRN

## 2015-07-22 MED ORDER — ACETAMINOPHEN 325 MG PO TABS: 650.0000 mg | ORAL_TABLET | ORAL | Status: DC | PRN

## 2015-07-22 MED ORDER — BISACODYL 10 MG RE SUPP: 10.0000 mg | Freq: Every day | RECTAL | Status: DC | PRN

## 2015-07-22 MED ORDER — SORBITOL 70 % SOLN: 30.0000 mL | Freq: Every day | Status: DC | PRN

## 2015-07-22 MED ORDER — ALPRAZOLAM 0.25 MG PO TABS
0.5000 mg | ORAL_TABLET | Freq: Three times a day (TID) | ORAL | Status: DC | PRN
Start: 2015-07-22 — End: 2015-07-27
  Administered 2015-07-23: 0.5 mg via ORAL
  Administered 2015-07-26 (×2): 0.25 mg via ORAL
  Filled 2015-07-22 (×2): qty 2

## 2015-07-22 MED ORDER — ATORVASTATIN CALCIUM 20 MG PO TABS
20.0000 mg | ORAL_TABLET | Freq: Every day | ORAL | Status: DC
  Administered 2015-07-23 – 2015-07-26 (×4): 20 mg via ORAL
  Filled 2015-07-22 (×4): qty 1

## 2015-07-22 NOTE — Progress Notes (Signed)
Placed patient on CPAP for the night.  

## 2015-07-22 NOTE — Progress Notes (Signed)
Physical Therapy Treatment Patient Details Name: Glenn Liu MRN: [id number] DOB: May 12, 1960 Today's Date: 07/22/2015    History of Present Illness 55 y.o. male with h/o HTN who presented to ED with AMS and R side weakness which lasted around 15-20 minutes. Per neurology note, pt feels he is at baseline now. CT Head 4/23 acute L thalamic hemorrhage    PT Comments    Patient demonstrated progress toward mobility goals. Continues to demo short term memory deficits. Continue to recommend CIR for further skilled PT services to maximize safety and independence with functional mobility.   Follow Up Recommendations  CIR;Supervision/Assistance - 24 hour     Equipment Recommendations  Rolling walker with 5" wheels    Recommendations for Other Services Rehab consult     Precautions / Restrictions Precautions Precautions: Fall Restrictions Weight Bearing Restrictions: No    Mobility  Bed Mobility               General bed mobility comments: OOB in chair upon arrival  Transfers Overall transfer level: Needs assistance Equipment used: None Transfers: Sit to/from Stand Sit to Stand: Min guard;Min assist         General transfer comment: min A upon standing to gain balance; no LOB  Ambulation/Gait Ambulation/Gait assistance: Min guard;Min assist Ambulation Distance (Feet): 100 Feet (X2) Assistive device: None Gait Pattern/deviations: Step-through pattern;Decreased stride length;Wide base of support (decreased arm swing) Gait velocity: decreased Gait velocity interpretation: Below normal speed for age/gender General Gait Details: unsteady at times especially with changes in velocity and with head turns; pt unable to perform head turns R and L without drifting to R and L; min A at times to maintain balance but no LOB; short steps   Stairs   Stairs assistance: Min assist Stair Management: Two rails;Forwards Number of Stairs: 6 General stair comments: vc for sequnencing  and increased safety awareness  Wheelchair Mobility    Modified Rankin (Stroke Patients Only) Modified Rankin (Stroke Patients Only) Pre-Morbid Rankin Score: No symptoms Modified Rankin: Moderate disability     Balance     Sitting balance-Leahy Scale: Good       Standing balance-Leahy Scale: Fair               High level balance activites: Side stepping;Backward walking;Direction changes;Head turns;Sudden stops High Level Balance Comments: unsteady when performing backward walking, sudden stops and changes in direction/velocity with no LOB     Cognition Arousal/Alertness: Awake/alert Behavior During Therapy: WFL for tasks assessed/performed Overall Cognitive Status: Impaired/Different from baseline Area of Impairment: Safety/judgement;Awareness;Problem solving;Memory     Memory: Decreased short-term memory   Safety/Judgement: Decreased awareness of safety;Decreased awareness of deficits Awareness: Emergent Problem Solving: Requires verbal cues (for safety) General Comments: repeated questions several times throughout session    Exercises      General Comments        Pertinent Vitals/Pain Pain Assessment: No/denies pain    Home Living                      Prior Function            PT Goals (current goals can now be found in the care plan section) Acute Rehab PT Goals Patient Stated Goal: go to rehab Progress towards PT goals: Progressing toward goals    Frequency  Min 4X/week    PT Plan Current plan remains appropriate    Co-evaluation             End of  Session Equipment Utilized During Treatment: Gait belt Activity Tolerance: Patient tolerated treatment well Patient left: in chair;with call bell/phone within reach;with chair alarm set     Time: [phone] PT Time Calculation (min) (ACUTE ONLY): 17 min  Charges:  $Gait Training: 8-22 mins                    G Codes:      Salina April, PTA Pager:  [phone]   07/22/2015, 9:46 AM

## 2015-07-22 NOTE — Progress Notes (Signed)
Speech Language Pathology Treatment: Cognitive-Linquistic  Patient Details Name: Glenn Liu MRN: [id number] DOB: [birth year] Today's Date: 07/22/2015 Time: [id number] SLP Time Calculation (min) (ACUTE ONLY): 32 min  Assessment / Plan / Recommendation Clinical Impression  Pt alert and pleasant- hard working and motivated. Focus on cognitive-linguistic gains this a.m. Pt reports feeling difficulty with "processing." Pt able to recall visits from care team this a.m. which were consistent with documentation in the chart. Completed reasoning activity which required information comparison and working memory. Initially pt required max A to complete, however learning noted and cueing was diminished to min/mod by the conclusion of the session. Pt asking questions re: timeframe of return to work. Encouraged pt to focus on recovery and suspend timeframe until further along in his recovery. Pt was receptive to education. Also discussed mental fatigue and the benefit of rest breaks when needed. Will follow.      HPI HPI: 55 y.o. male with h/o HTN who presented to ED with AMS and R side weakness which lasted around 15-20 minutes. Per neurology note, pt feels he is at baseline now. CT Head 4/23 acute L thalamic hemorrhage. Volume estimated at 3.6 cc. No prior h/o SLP intervention.      SLP Plan  Continue with current plan of care     Recommendations                Oral Care Recommendations: Oral care BID Follow up Recommendations: Inpatient Rehab Plan: Continue with current plan of care     Christie, [place] Pager [phone] 07/22/2015, 11:01 AM

## 2015-07-22 NOTE — H&P (View-Only) (Signed)
  Physical Medicine and Rehabilitation Admission H&P    Chief Complaint  Patient presents with  . Weakness  . Gait Problem  . Dizziness  : HPI: Glenn Liu is a 55 y.o. right handed male with unremarkable past medical history. Lives with spouse independent prior to admission. One level home with level entry. Patient is employed as a preacher. Wife works day shift. 3 children in the area also work. 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 Systolic blood pressures 260.CT of the head showed acute left thalamic hemorrhage 2.0 x 1.8 cm. Placed on a Cardene drip for blood pressure control. CTA of head and neck negative for vascular malformation. No anterior circulation large vessel stenosis. 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 Echocardiogram with ejection fraction 60% grade 1 diastolic dysfunction. Tolerating a regular diet. Presumed OSA with pulmonary service follow-up trial CPAP and follow-up sleep evaluation as outpatient. Physical therapy evaluation completed with recommendations of physical medicine rehabilitation consult.Patient was admitted for a comprehensive rehabilitation program  ROS Constitutional: Negative for fever and chills.  HENT: Negative for hearing loss.   Occasional headache  Eyes: Negative for blurred vision and double vision.  Respiratory: Negative for cough and shortness of breath.  Cardiovascular: Negative for chest pain and palpitations.  Gastrointestinal: Positive for constipation. Negative for nausea and vomiting.  Genitourinary: Negative for dysuria and hematuria.  Musculoskeletal: Positive for  myalgias and joint pain.  Skin: Negative for rash.  Neurological: Negative for seizures.  All other systems reviewed and are negative   History reviewed. No pertinent past medical history. History reviewed. No pertinent past surgical history. Family History  Problem Relation Age of Onset  . Heart attack Mother    Social History:  reports that he has never smoked. He does not have any smokeless tobacco history on file. His alcohol and drug histories are not on file. Allergies: No Known Allergies Medications Prior to Admission  Medication Sig Dispense Refill  . naphazoline-glycerin (CLEAR EYES) 0.012-0.2 % SOLN Place 1-2 drops into both eyes daily as needed for irritation.      Home: Home Living Family/patient expects to be discharged to:: Inpatient rehab Living Arrangements: Spouse/significant other, Children Available Help at Discharge: Family Type of Home: House Home Access: Level entry Home Layout: One level Bathroom Shower/Tub: Walk-in shower, Tub/shower unit Bathroom Toilet: Standard Home Equipment: None   Functional History: Prior Function Level of Independence: Independent  Functional Status:  Mobility: Bed Mobility Overal bed mobility: Needs Assistance Bed Mobility: Supine to Sit Supine to sit: Supervision Sit to supine: Min assist General bed mobility comments: not assessed-pt in chair Transfers Overall transfer level: Needs assistance Equipment used: 1 person hand held assist Transfers: Sit to/from Stand Sit to Stand: Supervision General transfer comment: mina for safety due to impulsively fast and decreased proprioception /self awareness Ambulation/Gait Ambulation/Gait assistance: Min assist Ambulation Distance (Feet): 100 Feet (x3) Assistive device: Rolling walker (2 wheeled), None, 1 person hand held assist Gait Pattern/deviations: Wide base of support, Antalgic, Step-through pattern, Decreased stride length General Gait Details: Pt unsteady but less  ataxic. Pt initially used L UE HHA and was pushing strongly onto PT. transitioned to RW in which pt demo'd increased stability   and improved gait pattern with improved fluidity, increased step length and decreased ataxic however required minA for walker management. then trialed ambulation without AD in which pt requiring minA due to staggering to L/R Gait velocity: decreased Gait velocity interpretation: Below normal speed for age/gender Stairs: Yes Stairs assistance: Min assist Stair Management: Two rails Number of Stairs: 6 General stair comments: directional v/c's for optimal sequencing and safety    ADL: ADL Overall ADL's : Needs assistance/impaired Lower Body Bathing: Min guard (standing with UE supported) Lower Body Dressing: Set up, Supervision/safety, Sit to/from stand Toilet Transfer: Ambulation, Min guard (sit to stand from chair) Toilet Transfer Details (indicate cue type and reason):   Functional mobility during ADLs: Min guard (for ambulation)  Cognition: Cognition Overall Cognitive Status: Impaired/Different from baseline Arousal/Alertness: Awake/alert Orientation Level: Oriented X4 Attention: Focused, Sustained Focused Attention: Appears intact Sustained Attention: Appears intact Memory: Impaired Memory Impairment: Retrieval deficit, Storage deficit Awareness: Appears intact Problem Solving: Appears intact Executive Function: Reasoning Reasoning: Appears intact Safety/Judgment: Appears intact Cognition Arousal/Alertness: Awake/alert Behavior During Therapy: WFL for tasks assessed/performed Overall Cognitive Status: Impaired/Different from baseline Area of Impairment: Safety/judgement, Memory Current Attention Level: Selective Memory: Decreased short-term memory Safety/Judgement:  (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 rate 20, height 5' 11" (1.803 m), weight 125.147 kg (275 lb 14.4 oz), SpO2 95 %. Physical Exam Constitutional: He is oriented to person, place, and time. He appears well-developed.  54-year-old obese male  HENT:  Head: Normocephalic and atraumatic.  Eyes: Conjunctivae and EOM are normal.  Neck: Normal range of motion. Neck supple. No thyromegaly present.  Cardiovascular: Normal rate and regular rhythm.  Respiratory: Effort normal and breath sounds normal. No respiratory distress.  GI: Soft. Bowel sounds are normal. He exhibits no distension.  Musculoskeletal: He exhibits no edema or tenderness.  Neurological: He is alert and oriented to person, place, and time.  Follows commands.  Fair awareness of deficits Sensation intact to light touch DTRs: Hyperreflexic bilateral lower extremities Extremely mild right facial weakness Motor: 5/5 throughout Negative ataxia and dysmetria bilateral upper extremities  Skin: Skin is warm and dry.  Psychiatric: He has a normal mood and affect. His behavior is normal. Thought content normal   Results for orders placed or performed during the hospital encounter of 07/19/15 (from the past 48 hour(s))  MRSA PCR Screening     Status: None   Collection Time: 07/19/15  4:22 PM  Result Value Ref Range   MRSA by PCR NEGATIVE NEGATIVE    Comment:        The GeneXpert MRSA Assay (FDA approved for NASAL specimens only), is one component of a comprehensive MRSA colonization surveillance program. It is not intended to diagnose MRSA infection nor to guide or monitor treatment for MRSA infections.   Hemoglobin A1c     Status: None   Collection Time: 07/19/15  5:08 PM  Result Value Ref Range   Hgb A1c MFr Bld 5.3 4.8 - 5.6 %    Comment: (NOTE)         Pre-diabetes: 5.7 - 6.4         Diabetes: >6.4         Glycemic control for adults with diabetes: <7.0    Mean Plasma Glucose 105 mg/dL      Comment: (NOTE) Performed At: [facility]   [place] [address] , [place] [id number] [place] F MD Ph:[phone]   Lipid panel     Status: Abnormal   Collection Time: 07/19/15  5:08 PM  Result Value Ref Range   Cholesterol 193 0 - 200 mg/dL   Triglycerides 104 <150 mg/dL   HDL 36 (L) >40 mg/dL   Total CHOL/HDL Ratio 5.4 RATIO   VLDL 21 0 - 40 mg/dL   LDL Cholesterol 136 (H) 0 - 99 mg/dL    Comment:        Total Cholesterol/HDL:CHD Risk Coronary Heart Disease Risk Table                     Men   Women  1/2 Average Risk   3.4   3.3  Average Risk       5.0   4.4  2 X Average Risk   9.6   7.1  3 X Average Risk  23.4   11.0        Use the calculated Patient Ratio above and the CHD Risk Table to determine the patient's CHD Risk.        ATP III CLASSIFICATION (LDL):  <100     mg/dL   Optimal  100-129  mg/dL   Near or Above                    Optimal  130-159  mg/dL   Borderline  160-189  mg/dL   High  >190     mg/dL   Very High   Basic metabolic panel     Status: Abnormal   Collection Time: 07/21/15  2:04 AM  Result Value Ref Range   Sodium 138 135 - 145 mmol/L   Potassium 3.1 (L) 3.5 - 5.1 mmol/L   Chloride 104 101 - 111 mmol/L   CO2 23 22 - 32 mmol/L   Glucose, Bld 110 (H) 65 - 99 mg/dL   BUN 21 (H) 6 - 20 mg/dL   Creatinine, Ser 1.45 (H) 0.61 - 1.24 mg/dL   Calcium 8.4 (L) 8.9 - 10.3 mg/dL   GFR calc non Af Amer 53 (L) >60 mL/min   GFR calc Af Amer >60 >60 mL/min    Comment: (NOTE) The eGFR has been calculated using the CKD EPI equation. This calculation has not been validated in all clinical situations. eGFR's persistently <60 mL/min signify possible Chronic Kidney Disease.    Anion gap 11 5 - 15  CBC     Status: None   Collection Time: 07/21/15  2:04 AM  Result Value Ref Range   WBC 10.1 4.0 - 10.5 K/uL   RBC 4.94 4.22 - 5.81 MIL/uL   Hemoglobin 13.8 13.0 - 17.0 g/dL    Comment: REPEATED TO VERIFY DELTA CHECK NOTED    HCT 40.3 39.0 -  52.0 %   MCV 81.6 78.0 - 100.0 fL   MCH 27.9 26.0 - 34.0 pg   MCHC 34.2 30.0 - 36.0 g/dL   RDW 14.0 11.5 - 15.5 %   Platelets 165 150 - 400 K/uL   Ct Angio Head W/cm &/or Wo Cm  07/19/2015  CLINICAL DATA:  54-year-old male with acute right side weakness and dizziness found have an acute left thalamic hemorrhage on noncontrast head CT. Initial encounter. EXAM: CT ANGIOGRAPHY HEAD AND NECK TECHNIQUE: Multidetector CT imaging of the head and neck was performed using the standard protocol during bolus administration of intravenous contrast. Multiplanar CT image reconstructions and MIPs   were obtained to evaluate the vascular anatomy. Carotid stenosis measurements (when applicable) are obtained utilizing NASCET criteria, using the distal internal carotid diameter as the denominator. CONTRAST:  75 mL Isovue 370 COMPARISON:  Noncontrast head CT 1347 hours today. FINDINGS: CTA NECK Skeleton: Occasional poor dentition. Lower cervical spine chronic disc and endplate degeneration. No acute osseous abnormality identified. Left maxillary sinus mucosal thickening. Other neck: Negative lung apices. No superior mediastinal lymphadenopathy. Negative thyroid, larynx, pharynx, parapharyngeal spaces, retropharyngeal space and sublingual space. No pharyngeal mass identified. Submandibular glands and parotid glands are within normal limits. Visualized orbit soft tissues are within normal limits. Left side cervical lymph nodes are within normal limits; there is a prominent left level IIa node as seen on sagittal image 179, but this has a maintained normal fatty hilum. There is a large cystic right neck mass situated at the level 2 nodal station between the right carotid space and right sternocleidomastoid muscle. This has simple fluid densitometry (10-15 Hounsfield units) and encompasses 57 x 32 x 42 mm (AP by transverse by CC). No surrounding inflammatory stranding. There does appear to be mild thickening of the wall of the lesion  in some areas (series 501, image 87). Otherwise no right side lymphadenopathy. Aortic arch: 3 vessel arch configuration. No arch atherosclerosis. No great vessel origin stenosis. Right carotid system: Mildly tortuous proximal right CCA. Soft plaque along the anterior wall of the right CCA at the larynx without stenosis. Circumferential soft plaque at the right carotid bifurcation with less than 50 % stenosis with respect to the distal vessel. Left carotid system: Negative proximal left CCA. Mild soft plaque in the medial left CCA at the level of the larynx, no stenosis. Circumferential soft plaque at the left carotid bifurcation. No stenosis. Mildly tortuous cervical left ICA. Vertebral arteries: Tortuous proximal right subclavian artery without stenosis. Soft plaque at the right vertebral artery origin resulting in moderate to severe stenosis (series 501, image 35). Otherwise negative right vertebral artery to the skullbase. No proximal left subclavian artery stenosis. Normal left vertebral artery origin. Tortuous left V1 segment. The left vertebral artery is mildly dominant and normal to the skullbase. CTA HEAD Posterior circulation: Mildly dominant distal left vertebral artery. Normal PICA vessels and vertebrobasilar junction. No distal vertebral stenosis. No basilar artery abnormality. Normal SCA and right PCA origins. There is mild irregularity and stenosis at the left PCA origin (series 503, image 109). Left posterior communicating artery is present. The right is diminutive or absent. Bilateral PCA branches are within normal limits. No CTA spot sign at the left thalamus region intra-axial hemorrhage. No evidence of associated vascular malformation. Anterior circulation: Both ICA siphons are patent with mild calcified plaque and no stenosis. Normal ophthalmic and left posterior communicating artery origins. Normal carotid termini. Normal MCA and ACA origins. The anterior communicating artery is ectatic, but  without discrete aneurysm. Bilateral ACA branches are normal aside from mild ectasia/tortuosity. Right MCA M1 segment, bifurcation, and most right MCA branches are within normal limits. There is mild to moderate irregularity and stenosis in the proximal aspect of the dominant left posterior sylvian division (series 506, image 16). Left MCA M1 segment, bifurcation, and most left MCA branches are within normal limits. There is mild irregularity and stenosis in the posterior left MCA M2/M3 branches (series 506, image 32). Venous sinuses: Patent. Anatomic variants: Mildly dominant left vertebral artery and mildly fetal type left PCA origin. IMPRESSION: 1. Negative for CTA spot sign or evidence of vascular malformation associated with the   left thalamic region hemorrhage. 2. 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 No anterior circulation large vessel stenosis despite plaque, but there is bilateral MCA M2/M3 mild to moderate irregularity and stenosis. 3. Posterior circulation remarkable for moderate to severe stenosis at the right vertebral artery origin, and mild stenosis at the left PCA origin. 4. There is a large cystic right neck mass measuring 5.7 cm. 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 ENT follow-up is necessary. Study discussed by telephone with Dr. RAM NANDIGAM on 07/19/2015 at 16:44 . Electronically Signed   By: H  Hall M.D.   On: 07/19/2015 16:47   Ct Angio Neck W/cm &/or Wo/cm  07/19/2015  CLINICAL DATA:  54-year-old male with acute right side weakness and dizziness found have an acute left thalamic hemorrhage on noncontrast head CT. Initial encounter. EXAM: CT ANGIOGRAPHY HEAD AND NECK TECHNIQUE: Multidetector CT imaging of the head and neck was performed using the standard protocol  during bolus administration of intravenous contrast. Multiplanar CT image reconstructions and MIPs were obtained to evaluate the vascular anatomy. Carotid stenosis measurements (when applicable) are obtained utilizing NASCET criteria, using the distal internal carotid diameter as the denominator. CONTRAST:  75 mL Isovue 370 COMPARISON:  Noncontrast head CT 1347 hours today. FINDINGS: CTA NECK Skeleton: Occasional poor dentition. Lower cervical spine chronic disc and endplate degeneration. No acute osseous abnormality identified. Left maxillary sinus mucosal thickening. Other neck: Negative lung apices. No superior mediastinal lymphadenopathy. Negative thyroid, larynx, pharynx, parapharyngeal spaces, retropharyngeal space and sublingual space. No pharyngeal mass identified. Submandibular glands and parotid glands are within normal limits. Visualized orbit soft tissues are within normal limits. Left side cervical lymph nodes are within normal limits; there is a prominent left level IIa node as seen on sagittal image 179, but this has a maintained normal fatty hilum. There is a large cystic right neck mass situated at the level 2 nodal station between the right carotid space and right sternocleidomastoid muscle. This has simple fluid densitometry (10-15 Hounsfield units) and encompasses 57 x 32 x 42 mm (AP by transverse by CC). No surrounding inflammatory stranding. There does appear to be mild thickening of the wall of the lesion in some areas (series 501, image 87). Otherwise no right side lymphadenopathy. Aortic arch: 3 vessel arch configuration. No arch atherosclerosis. No great vessel origin stenosis. Right carotid system: Mildly tortuous proximal right CCA. Soft plaque along the anterior wall of the right CCA at the larynx without stenosis. Circumferential soft plaque at the right carotid bifurcation with less than 50 % stenosis with respect to the distal vessel. Left carotid system: Negative proximal left CCA.  Mild soft plaque in the medial left CCA at the level of the larynx, no stenosis. Circumferential soft plaque at the left carotid bifurcation. No stenosis. Mildly tortuous cervical left ICA. Vertebral arteries: Tortuous proximal right subclavian artery without stenosis. Soft plaque at the right vertebral artery origin resulting in moderate to severe stenosis (series 501, image 35). Otherwise negative right vertebral artery to the skullbase. No proximal left subclavian artery stenosis. Normal left vertebral artery origin. Tortuous left V1 segment. The left vertebral artery is mildly dominant and normal to the skullbase. CTA HEAD Posterior circulation: Mildly dominant distal left vertebral artery. Normal PICA vessels and vertebrobasilar junction. No distal vertebral stenosis. No basilar   artery abnormality. Normal SCA and right PCA origins. There is mild irregularity and stenosis at the left PCA origin (series 503, image 109). Left posterior communicating artery is present. The right is diminutive or absent. Bilateral PCA branches are within normal limits. No CTA spot sign at the left thalamus region intra-axial hemorrhage. No evidence of associated vascular malformation. Anterior circulation: Both ICA siphons are patent with mild calcified plaque and no stenosis. Normal ophthalmic and left posterior communicating artery origins. Normal carotid termini. Normal MCA and ACA origins. The anterior communicating artery is ectatic, but without discrete aneurysm. Bilateral ACA branches are normal aside from mild ectasia/tortuosity. Right MCA M1 segment, bifurcation, and most right MCA branches are within normal limits. There is mild to moderate irregularity and stenosis in the proximal aspect of the dominant left posterior sylvian division (series 506, image 16). Left MCA M1 segment, bifurcation, and most left MCA branches are within normal limits. There is mild irregularity and stenosis in the posterior left MCA M2/M3  branches (series 506, image 32). Venous sinuses: Patent. Anatomic variants: Mildly dominant left vertebral artery and mildly fetal type left PCA origin. IMPRESSION: 1. Negative for CTA spot sign or evidence of vascular malformation associated with the left thalamic region hemorrhage. 2. 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 No anterior circulation large vessel stenosis despite plaque, but there is bilateral MCA M2/M3 mild to moderate irregularity and stenosis. 3. Posterior circulation remarkable for moderate to severe stenosis at the right vertebral artery origin, and mild stenosis at the left PCA origin. 4. There is a large cystic right neck mass measuring 5.7 cm. 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 ENT follow-up is necessary. Study discussed by telephone with Dr. RAM NANDIGAM on 07/19/2015 at 16:44 . Electronically Signed   By: H  Hall M.D.   On: 07/19/2015 16:47   Ct Angio Chest/abd/pel For Dissection W And/or W/wo  07/20/2015  CLINICAL DATA:  Elevated blood pressure in the emergency department yesterday. No history of hypertension. Evaluate for aortic dissection. EXAM: CT ANGIOGRAPHY CHEST, ABDOMEN AND PELVIS TECHNIQUE: Multidetector CT imaging through the chest, abdomen and pelvis was performed using the standard protocol during bolus administration of intravenous contrast. Multiplanar reconstructed images and MIPs were obtained and reviewed to evaluate the vascular anatomy. CONTRAST:  100 ml Isovue 370 COMPARISON:  Neck CTA 07/19/2015 FINDINGS: CTA CHEST FINDINGS Mediastinum: The pulmonary arteries are well opacified with contrast. There is no evidence of acute pulmonary embolism. Pre contrast images demonstrate no displaced intimal calcifications within the aorta or great vessels.  Post-contrast, the aorta enhances normally. There is no evidence of dissection or aneurysm. The heart size is normal. There is no pericardial effusion. There is probable left ventricular hypertrophy. There are no enlarged mediastinal, hilar or axillary lymph nodes. Small hiatal hernia. Lungs/Pleura: There is no pleural effusion.The lungs are clear on the noncontrast images. There is mild dependent atelectasis on the contrasted images. Musculoskeletal/Chest wall: No chest wall lesion or acute osseous findings. Review of the MIP images confirms the above findings. CTA ABDOMEN AND PELVIS FINDINGS Hepatobiliary: The hepatic density is decreased consistent with steatosis. No focal lesions are identified post-contrast. No evidence of gallstones, gallbladder wall thickening or biliary dilatation. Pancreas: Unremarkable. No pancreatic ductal dilatation or surrounding inflammatory changes. Spleen: Normal in size without focal abnormality.   Adrenals/Urinary Tract: Both adrenal glands appear normal. Nonobstructing calculi are present in the upper and lower poles of the left kidney. There are left renal cysts measuring up to 3.2 cm in the upper pole. There is a small exophytic lesion in the lower pole the left kidney measuring 8 mm on image 185 and in indeterminate density of 45 HU. This is not imaged prior to contrast administration. The right kidney appears normal. The bladder appears normal. Stomach/Bowel: No evidence of bowel wall thickening, distention or surrounding inflammatory change. There are moderate diverticular changes within the descending and sigmoid colon. The appendix appears normal. Vascular/Lymphatic: There are no enlarged abdominal or pelvic lymph nodes. Mild aortoiliac atherosclerosis. No evidence of dissection, aneurysm or retroperitoneal hemorrhage. No evidence of large vessel occlusion. Reproductive: The prostate gland and seminal vesicles appear unremarkable. Other: Small umbilical hernia containing only  fat. Musculoskeletal: No acute or significant osseous findings. Mild lower lumbar spondylosis. Review of the MIP images confirms the above findings. IMPRESSION: 1. No acute vascular findings in the chest and abdomen or pelvis. No evidence of aortic dissection or aneurysm. Minimal atherosclerosis noted. 2. Probable left ventricular hypertrophy. 3. Left renal cysts, including a small deform it lesion in the lower pole the left kidney. 4. Hepatic steatosis and small hiatal hernia. Electronically Signed   By: William  Veazey M.D.   On: 07/20/2015 13:49    Medical Problem List and Plan: 1.  Altered mental status and right-sided weakness secondary to left thalamic intracerebral hemorrhage secondary to hypertensive crisis 2.  DVT Prophylaxis/Anticoagulation: SCDs. Monitor for any signs of DVT 3. Pain Management: Tylenol as needed 4. Hypertension. Norvasc 10 mg daily, labetalol 100 mg 3 times a day, lisinopril 20 mg twice a day. Monitor with increased mobility 5. Neuropsych: This patient is ?capable of making decisions on his own behalf. 6. Skin/Wound Care: Routine skin checks 7. Fluids/Electrolytes/Nutrition: Routine eye and nose with follow-up chemistries  Hypokalemia: Continue to monitor 8. Hyperlipidemia. Lipitor 9. Mood/anxiety. Xanax 0.5 mg 3 times daily as needed. 10. Probable sleep apnea. Plan outpatient evaluation 11. Morbid obesity. Body mass index 38.5 kg. Dietary follow-up 12. Presumed OSA: Continue CPAP 13. AKI: Avoid nephrotoxic meds  Post Admission Physician Evaluation: 1. 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 2. Patient is admitted to receive collaborative, interdisciplinary care between the physiatrist, rehab nursing staff, and therapy team. 3. Patient's level of medical complexity and substantial therapy needs in context of that medical necessity cannot be provided at a lesser intensity of care such as a  SNF. 4. Patient has experienced substantial functional loss from his/her baseline which was documented above under the "Functional History" and "Functional Status" headings.  Judging by the patient's diagnosis, physical exam, and functional history, the patient has potential for functional progress which will result in measurable gains while on inpatient rehab.  These gains will be of substantial and practical use upon discharge  in facilitating mobility and self-care at the household level. 5. Physiatrist will provide 24 hour management of medical needs as well as oversight of the therapy plan/treatment and provide guidance as appropriate regarding the interaction of the two. 6. 24 hour rehab nursing will assist with safety, disease management and patient education and help integrate therapy concepts, techniques,education, etc. 7. PT will assess and treat for/with: Lower extremity strength, range of motion, stamina, balance, functional mobility, safety, adaptive techniques and equipment, coping skills, pain control, stroke education.   Goals are: Mod I/Supervision. 8. OT will   assess and treat for/with: ADL's, functional mobility, safety, upper extremity strength, adaptive techniques and equipment, ego support, and community reintegration.   Goals are: Mod I. Therapy may proceed with showering this patient. 9. SLP will assess and treat for/with: speech, High-level cognition.  Goals are: Mod I/Ind. 10. Case Management and Social Worker will assess and treat for psychological issues and discharge planning. 11. Team conference will be held weekly to assess progress toward goals and to determine barriers to discharge. 12. Patient will receive at least 3 hours of therapy per day at least 5 days per week. 13. ELOS: 5-8 days.     14. Prognosis:  excellent  Ankit Patel, MD 07/21/2015 

## 2015-07-22 NOTE — Discharge Summary (Signed)
Stroke Discharge Summary  Patient ID: Glenn Liu   MRN: [id number]      DOB: [birth year]  Date of Admission: 07/19/2015 Date of Discharge: 07/22/2015  Attending Physician:  Rosalin Hawking, MD, Stroke MD Consulting Physician(s):    Duwaine Maxin, MD (pulmonary/intensive care ), Delice Lesch, MD (Physical Medicine & Rehabtilitation)  Patient's PCP:  No primary care provider on file.  Discharge Diagnoses:  Principal Problem:   ICH (intracerebral hemorrhage) (HCC) - L thalamic due to hypertensive emergency Active Problems:   OSA (obstructive sleep apnea)   Hypokalemia   AKI (acute kidney injury) ([place])   Gait disturbance, post-stroke   Hypertensive emergency   Hyperlipidemia   Obesity BMI  Body mass index is 38.5 kg/(m^2).   History reviewed. No pertinent past medical history. History reviewed. No pertinent past surgical history.  Medications to be continued on Rehab .  stroke: mapping our early stages of recovery book   Does not apply Once  . amLODipine  10 mg Oral Daily  . atorvastatin  20 mg Oral q1800  . labetalol  200 mg Oral TID  . lisinopril  20 mg Oral BID  . pantoprazole  40 mg Oral Daily  . senna-docusate  1 tablet Oral BID    LABORATORY STUDIES CBC    Component Value Date/Time   WBC 9.8 07/22/2015 0222   RBC 4.98 07/22/2015 0222   HGB 13.8 07/22/2015 0222   HCT 41.1 07/22/2015 0222   PLT 165 07/22/2015 0222   MCV 82.5 07/22/2015 0222   MCH 27.7 07/22/2015 0222   MCHC 33.6 07/22/2015 0222   RDW 13.7 07/22/2015 0222   LYMPHSABS 1.7 07/19/2015 1321   MONOABS 0.7 07/19/2015 1321   EOSABS 0.1 07/19/2015 1321   BASOSABS 0.1 07/19/2015 1321   CMP    Component Value Date/Time   NA 140 07/22/2015 0222   K 3.4* 07/22/2015 0222   CL 104 07/22/2015 0222   CO2 25 07/22/2015 0222   GLUCOSE 96 07/22/2015 0222   BUN 17 07/22/2015 0222   CREATININE 1.43* 07/22/2015 0222   CALCIUM 8.6* 07/22/2015 0222   PROT 9.0* 07/19/2015 1321   ALBUMIN 3.8 07/19/2015 1321   AST  29 07/19/2015 1321   ALT 23 07/19/2015 1321   ALKPHOS 134* 07/19/2015 1321   BILITOT 0.9 07/19/2015 1321   GFRNONAA 54* 07/22/2015 0222   GFRAA >60 07/22/2015 0222   COAGS Lab Results  Component Value Date   INR 1.04 07/19/2015   Lipid Panel    Component Value Date/Time   CHOL 193 07/19/2015 1708   TRIG 104 07/19/2015 1708   HDL 36* 07/19/2015 1708   CHOLHDL 5.4 07/19/2015 1708   VLDL 21 07/19/2015 1708   LDLCALC 136* 07/19/2015 1708   HgbA1C  Lab Results  Component Value Date   HGBA1C 5.3 07/19/2015   Cardiac Panel (last 3 results) No results for input(s): CKTOTAL, CKMB, TROPONINI, RELINDX in the last 72 hours. Urinalysis No results found for: COLORURINE, APPEARANCEUR, LABSPEC, PHURINE, GLUCOSEU, HGBUR, BILIRUBINUR, KETONESUR, PROTEINUR, UROBILINOGEN, NITRITE, LEUKOCYTESUR Urine Drug Screen No results found for: LABOPIA, COCAINSCRNUR, LABBENZ, AMPHETMU, THCU, LABBARB  Alcohol Level No results found for: [facility]   SIGNIFICANT DIAGNOSTIC STUDIES Ct Head Wo Contrast 07/19/2015 Acute left thalamic hemorrhage as described above. Volume is estimated at 3.6 cc.   Ct Angio Head & Neck W/cm &/or Wo/cm 07/19/2015 1. Negative for CTA spot sign or evidence of vascular malformation associated with the left thalamic region hemorrhage. 2. Intra- and  extra cranial atherosclerosis appears advanced for age. No anterior circulation large vessel stenosis despite plaque, but there is bilateral MCA M2/M3 mild to moderate irregularity and stenosis. 3. Posterior circulation remarkable for moderate to severe stenosis at the right vertebral artery origin, and mild stenosis at the left PCA origin. 4. There is a large cystic right neck mass measuring 5.7 cm. 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 ENT follow-up is  necessary.   CTA Chest/Abd/Pelvis 07/20/2015  1. No acute vascular findings in the chest and abdomen or pelvis. No evidence of aortic dissection or aneurysm. Minimal atherosclerosis noted. 2. Probable left ventricular hypertrophy. 3. Left renal cysts, including a small deform it lesion in the lower pole the left kidney. 4. Hepatic steatosis and small hiatal hernia.  2D echo  - Normal LV systolic function; grade 1 diastolic dysfunction with elevated LV filliing pressure; moderate LVH; mild LAE; mild AI; echodensity in aortic arch/descending aorta (? Reverberation; cannot exclude dissection); suggest CTA or MRA to further assess.     HISTORY OF PRESENT ILLNESS Glenn Liu is an 55 y.o. male patient who was [place] into the emergency room from church with transient symptoms of altered mental status and right-sided weakness. 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 All these symptoms lasted around 15-20 minutes per history from patient. When he was brought into the triage, his blood pressures reportedly in 120s the triage recording. Initial CT of the head showed a left thalamic intracerebral hemorrhage with no significant mass effect or intraventricular extension. His blood pressure in ER was extremely high at systolics [id number]. At this time patient denies any focal sensorimotor symptoms or vision speech problems no incoordination. He feels he is at his baseline now. Patient was not administered IV t-PA secondary to [place]. He was admitted to the neuro ICU for further evaluation and treatment.    HOSPITAL COURSE Mr. Junior Fetterhoff is a 55 y.o. male with history of hypertension presenting with altered mental status and right-sided weakness. CT showed a left thalamic intracerebral hemorrhage.    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 L renal cysts, LVH  LDL 136  HgbA1c 5.3  No antithrombotic prior to admission  Ongoing aggressive stroke risk factor management  Therapy recommendations: CIR  Disposition: CIR  Hypertensive emergency  SBP 260s on arrival in setting of neurologic symptoms  started on clevidipine, labetalol drip added, clevidipine changed to nicardipine. Weaned off when changed to POs  Not on blood pressure medicines PTA  Add norvasc 10 mg daily and lisinopril 20 mg bid and labetalol 200mg  tid  Improved in hospital to 140-160s  Hyperlipidemia  Home meds: No statin  LDL 136  Added lipitor 20 mg daily  Continue statin at discharge  Other Stroke Risk Factors  Obesity, Body mass index is 38.5 kg/(m^2).   Probable  sleep apnea, needs OP eval  Other Active Problems  Hypokalemia, K 3.3 replace. Recheck in am  R large cystic right neck mass, needs OP ENT follow up   DISCHARGE EXAM Blood pressure 155/86, pulse 67, temperature 97.9 F (36.6 C), temperature source Oral, resp. rate 20, height 5\' 11"  (1.803 m), weight 125.147 kg (275 lb 14.4 oz), SpO2 95 %. General - Well nourished, well developed, in no apparent distress.  Ophthalmologic - Sharp disc margins OU.   Cardiovascular - Regular rate and rhythm with no murmur.  Neck - right neck palpable mass, no tenderness  Mental Status -  Level of arousal and orientation to time, place, and person were intact. Language including expression, naming, repetition, comprehension was assessed and found intact. Fund of Knowledge was assessed and was  intact.  Cranial Nerves II - XII - II - Visual field intact OU. III, IV, VI - Extraocular movements intact. V - Facial sensation intact bilaterally. VII - Facial movement intact bilaterally. VIII - Hearing & vestibular intact bilaterally. X - Palate elevates symmetrically. XI - Chin turning & shoulder shrug intact bilaterally. XII - Tongue protrusion intact.  Motor Strength - The patient's strength was normal in all extremities and pronator drift is absent. Bulk was normal and fasciculations were absent.  Motor Tone - Muscle tone was assessed at the neck and appendages and was normal.  Reflexes - The patient's reflexes were 1+ in all extremities and he had no pathological reflexes.  Sensory - Light touch, temperature/pinprick were assessed and were symmetrical.   Coordination - The patient had normal movements in the hands and feet with no ataxia or dysmetria. Tremor was absent.  Gait and Station - not tested due to HTN on two drips.   Discharge Diet  Diet Heart Room service appropriate?: Yes; Fluid consistency:: Thin liquids  DISCHARGE PLAN  Disposition:  Transfer to [place] for ongoing PT, OT and ST  Due to hemorrhage and risk of bleeding, do not take aspirin, aspirin-containing medications, or ibuprofen products   Recommend ongoing risk factor control by Primary Care Physician at time of discharge from inpatient rehabilitation.  Follow-up primary care provider in 2 weeks following discharge from rehab. Patient instructed to get a PCP if he does not have one.  Follow-up with Dr. Rosalin Hawking, Stroke Clinic in 1 month, office to schedule an appointment.   35 minutes were spent preparing discharge.  [place] for Pager information 07/22/2015 3:14 PM   I, the attending vascular neurologist, have personally obtained a history, examined the patient, evaluated laboratory data, individually viewed imaging studies and  agree with radiology interpretations. I also discussed with pt and CIR coordinator regarding his care plan. Together with the NP/PA, we formulated the assessment and plan of care which reflects our mutual decision.  I have made any additions or clarifications directly to the above note and agree with the findings and plan as currently documented.    Rosalin Hawking, MD PhD Stroke Neurology 07/22/2015 10:00 PM

## 2015-07-22 NOTE — Progress Notes (Signed)
Discharge orders received. Pt notified and verbalized understanding. Report given to 15M RN. Tele and IVs removed. Pt and belongings transported by staff to 15M07.

## 2015-07-22 NOTE — Progress Notes (Signed)
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 right handed male with unremarkable past medical history. Lives with spouse independent prior to admission. One level home with level entry. Patient is employed as a Environmental education officer. Wife works day shift. 3 children in the area also work. 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 Systolic blood pressures 123456.CT of the head showed acute left thalamic hemorrhage 2.0 x 1.8 cm. Placed on a Cardene drip for blood pressure control. CTA of head and neck negative for vascular malformation. No anterior circulation large vessel stenosis. 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 Echocardiogram with ejection fraction 123456 grade 1 diastolic dysfunction. Tolerating a regular diet. Presumed OSA with pulmonary service follow-up trial CPAP and follow-up sleep evaluation as outpatient. Physical therapy evaluation completed with recommendations of physical medicine rehabilitation consult.  Review of Systems  Constitutional: Negative for fever and chills.  HENT: Negative for hearing loss.   Occasional headache  Eyes: Negative for blurred vision and double vision.  Respiratory: Negative for cough and shortness of breath.  Cardiovascular: Negative for chest pain and  palpitations.  Gastrointestinal: Positive for constipation. Negative for nausea and vomiting.  Genitourinary: Negative for dysuria and hematuria.  Musculoskeletal: Positive for myalgias and joint pain.  Skin: Negative for rash.  Neurological: Negative for seizures.  All other systems reviewed and are negative.  History reviewed. No pertinent past medical history. History reviewed. No pertinent past surgical history. Family History  Problem Relation Age of Onset  . Heart attack Mother    Social History:  reports that he has never smoked. He does not have any smokeless tobacco history on file. His alcohol and drug histories are not on file. Allergies: No Known Allergies Medications Prior to Admission  Medication Sig Dispense Refill  . naphazoline-glycerin (CLEAR EYES) 0.012-0.2 % SOLN Place 1-2 drops into both eyes daily as needed for irritation.      Home: Home Living Family/patient expects to be discharged to:: Private residence Living Arrangements: Spouse/significant other, Children Available Help at Discharge: Family Type of Home: House Home Access: Level entry [place]: One level Bathroom Shower/Tub: Tub/shower unit Home Equipment: None  Functional History: Prior Function Level of Independence: Independent Functional Status:  Mobility: Bed Mobility Overal bed mobility: Needs Assistance Bed Mobility: Sit to Supine Sit to supine: Min assist General bed mobility comments: min assist to elevate LEs to bed and reposition Transfers Overall transfer level: Needs assistance Equipment used: 1 person hand held assist Transfers: Sit to/from Stand Sit to Stand: Min assist General transfer comment: min assist to come to standing, posterior LOB noted and instability in static standing upon reaching upright Ambulation/Gait Ambulation/Gait assistance: Mod assist Ambulation Distance (Feet): 60 Feet Assistive device: 1 person hand held assist Gait  Pattern/deviations: Shuffle, Ataxic, Staggering right, Staggering left, Wide base of support General Gait Details: patient with poorly coordinated gait, multiple LOB noted. Patient  required increased physical assist to maintain stability. Easily fatigues with limited mobility (cues to improve cadence) Gait velocity: decreased Gait velocity interpretation: Below normal speed for age/gender    ADL:    Cognition: Cognition Overall Cognitive Status: Impaired/Different from baseline Arousal/Alertness: Awake/alert Orientation Level: Oriented X4 Attention: Focused, Sustained Focused Attention: Appears intact Sustained Attention: Appears intact Memory: Impaired Memory Impairment: Retrieval deficit, Storage deficit Awareness: Appears intact Problem Solving: Appears intact Executive Function: Reasoning Reasoning: Appears intact Safety/Judgment: Appears intact Cognition Arousal/Alertness: Awake/alert Behavior During Therapy: Impulsive Overall Cognitive Status: Impaired/Different from baseline Area of Impairment: Attention, Safety/judgement, Awareness, Problem solving Current Attention Level: Selective Safety/Judgement: Decreased awareness of safety, Decreased awareness of deficits Awareness: Emergent Problem Solving: Requires verbal cues, Requires tactile cues  Blood pressure 165/80, pulse 70, temperature 98 F (36.7 C), temperature source Oral, resp. rate 16, height 5\' 11"  (1.803 m), weight 125.147 kg (275 lb 14.4 oz), SpO2 98 %. Physical Exam  Vitals reviewed. Constitutional: He is oriented to person, place, and time. He appears well-developed.  55 year old obese male  HENT:  Head: Normocephalic and atraumatic.  Eyes: Conjunctivae and EOM are normal.  Neck: Normal range of motion. Neck supple. No thyromegaly present.  Cardiovascular: Normal rate and regular rhythm.  Respiratory: Effort normal and breath sounds normal. No respiratory distress.  GI: Soft. Bowel sounds are normal.  He exhibits no distension.  Musculoskeletal: He exhibits no edema or tenderness.  Neurological: He is alert and oriented to person, place, and time.  Follows commands.  Fair awareness of deficits Sensation intact to light touch DTRs: Hyperreflexic bilateral lower extremities Extremely mild right facial weakness Motor: 5/5 throughout Negative ataxia and dysmetria bilateral upper extremities  Skin: Skin is warm and dry.  Psychiatric: He has a normal mood and affect. His behavior is normal. Thought content normal.    Lab Results Last 24 Hours    Results for orders placed or performed during the hospital encounter of 07/19/15 (from the past 24 hour(s))  Basic metabolic panel Status: Abnormal   Collection Time: 07/21/15 2:04 AM  Result Value Ref Range   Sodium 138 135 - 145 mmol/L   Potassium 3.1 (L) 3.5 - 5.1 mmol/L   Chloride 104 101 - 111 mmol/L   CO2 23 22 - 32 mmol/L   Glucose, Bld 110 (H) 65 - 99 mg/dL   BUN 21 (H) 6 - 20 mg/dL   Creatinine, Ser 1.45 (H) 0.61 - 1.24 mg/dL   Calcium 8.4 (L) 8.9 - 10.3 mg/dL   GFR calc non Af Amer 53 (L) >60 mL/min   GFR calc Af Amer >60 >60 mL/min   Anion gap 11 5 - 15  CBC Status: None   Collection Time: 07/21/15 2:04 AM  Result Value Ref Range   WBC 10.1 4.0 - 10.5 K/uL   RBC 4.94 4.22 - 5.81 MIL/uL   Hemoglobin 13.8 13.0 - 17.0 g/dL   HCT 40.3 39.0 - 52.0 %   MCV 81.6 78.0 - 100.0 fL   MCH 27.9 26.0 - 34.0 pg   MCHC 34.2 30.0 - 36.0 g/dL   RDW 14.0 11.5 - 15.5 %   Platelets 165 150 - 400 K/uL      Imaging Results (Last 48 hours)    Ct Angio Head W/cm &/or Wo Cm  07/19/2015 CLINICAL DATA: 55 year old male with acute right side weakness and dizziness found have an acute left thalamic hemorrhage on noncontrast head CT. Initial encounter. EXAM: CT ANGIOGRAPHY HEAD AND NECK TECHNIQUE: Multidetector CT imaging  of the head and neck was  performed using the standard protocol during bolus administration of intravenous contrast. Multiplanar CT image reconstructions and MIPs were obtained to evaluate the vascular anatomy. Carotid stenosis measurements (when applicable) are obtained utilizing NASCET criteria, using the distal internal carotid diameter as the denominator. CONTRAST: 75 mL Isovue 370 COMPARISON: Noncontrast head CT 1347 hours today. FINDINGS: CTA NECK Skeleton: Occasional poor dentition. Lower cervical spine chronic disc and endplate degeneration. No acute osseous abnormality identified. Left maxillary sinus mucosal thickening. Other neck: Negative lung apices. No superior mediastinal lymphadenopathy. Negative thyroid, larynx, pharynx, parapharyngeal spaces, retropharyngeal space and sublingual space. No pharyngeal mass identified. Submandibular glands and parotid glands are within normal limits. Visualized orbit soft tissues are within normal limits. Left side cervical lymph nodes are within normal limits; there is a prominent left level IIa node as seen on sagittal image 179, but this has a maintained normal fatty hilum. There is a large cystic right neck mass situated at the level 2 nodal station between the right carotid space and right sternocleidomastoid muscle. This has simple fluid densitometry (10-15 Hounsfield units) and encompasses 57 x 32 x 42 mm (AP by transverse by CC). No surrounding inflammatory stranding. There does appear to be mild thickening of the wall of the lesion in some areas (series 501, image 87). Otherwise no right side lymphadenopathy. Aortic arch: 3 vessel arch configuration. No arch atherosclerosis. No great vessel origin stenosis. Right carotid system: Mildly tortuous proximal right CCA. Soft plaque along the anterior wall of the right CCA at the larynx without stenosis. Circumferential soft plaque at the right carotid bifurcation with less than 50 % stenosis with respect to the distal vessel. Left carotid  system: Negative proximal left CCA. Mild soft plaque in the medial left CCA at the level of the larynx, no stenosis. Circumferential soft plaque at the left carotid bifurcation. No stenosis. Mildly tortuous cervical left ICA. Vertebral arteries: Tortuous proximal right subclavian artery without stenosis. Soft plaque at the right vertebral artery origin resulting in moderate to severe stenosis (series 501, image 35). Otherwise negative right vertebral artery to the skullbase. No proximal left subclavian artery stenosis. Normal left vertebral artery origin. Tortuous left V1 segment. The left vertebral artery is mildly dominant and normal to the skullbase. CTA HEAD Posterior circulation: Mildly dominant distal left vertebral artery. Normal PICA vessels and vertebrobasilar junction. No distal vertebral stenosis. No basilar artery abnormality. Normal SCA and right PCA origins. There is mild irregularity and stenosis at the left PCA origin (series 503, image 109). Left posterior communicating artery is present. The right is diminutive or absent. Bilateral PCA branches are within normal limits. No CTA spot sign at the left thalamus region intra-axial hemorrhage. No evidence of associated vascular malformation. Anterior circulation: Both ICA siphons are patent with mild calcified plaque and no stenosis. Normal ophthalmic and left posterior communicating artery origins. Normal carotid termini. Normal MCA and ACA origins. The anterior communicating artery is ectatic, but without discrete aneurysm. Bilateral ACA branches are normal aside from mild ectasia/tortuosity. Right MCA M1 segment, bifurcation, and most right MCA branches are within normal limits. There is mild to moderate irregularity and stenosis in the proximal aspect of the dominant left posterior sylvian division (series 506, image 16). Left MCA M1 segment, bifurcation, and most left MCA branches are within normal limits. There is mild irregularity and stenosis in  the posterior left MCA M2/M3 branches (series 506, image 32). Venous sinuses: Patent. Anatomic variants: Mildly dominant left vertebral artery  and mildly fetal type left PCA origin. IMPRESSION: 1. Negative for CTA spot sign or evidence of vascular malformation associated with the left thalamic region hemorrhage. 2. 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 No anterior circulation large vessel stenosis despite plaque, but there is bilateral MCA M2/M3 mild to moderate irregularity and stenosis. 3. Posterior circulation remarkable for moderate to severe stenosis at the right vertebral artery origin, and mild stenosis at the left PCA origin. 4. There is a large cystic right neck mass measuring 5.7 cm. 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 ENT follow-up is necessary. Study discussed by telephone with Dr. Audria Nine on 07/19/2015 at 16:44 . Electronically Signed By: Genevie Ann M.D. On: 07/19/2015 16:47   Ct Head Wo Contrast  07/19/2015 CLINICAL DATA: Right side weakness and dizziness. Initial encounter. EXAM: CT HEAD WITHOUT CONTRAST TECHNIQUE: Contiguous axial images were obtained from the base of the skull through the vertex without intravenous contrast. COMPARISON: None. FINDINGS: The patient has an acute hemorrhage in the left thalamus measuring approximately 2.0 x 1.8 cm in the axial plane x 2 cm craniocaudal for total volume of 3.6 cc. Also seen is some chronic microvascular ischemic change. No mass lesion, midline shift, hydrocephalus, subarachnoid or subdural hemorrhage is identified. Mucosal thickening in the maxillary sinuses is worse on the left. The calvarium is intact. IMPRESSION: Acute left thalamic hemorrhage as described above. Volume is estimated at 3.6 cc. These results were  called by telephone at the time of interpretation on 07/19/2015 at 2:00 pm to Dr. Silverio Decamp, who verbally acknowledged these results. Electronically Signed By: Inge Rise M.D. On: 07/19/2015 14:01   Ct Angio Neck W/cm &/or Wo/cm  07/19/2015 CLINICAL DATA: 55 year old male with acute right side weakness and dizziness found have an acute left thalamic hemorrhage on noncontrast head CT. Initial encounter. EXAM: CT ANGIOGRAPHY HEAD AND NECK TECHNIQUE: Multidetector CT imaging of the head and neck was performed using the standard protocol during bolus administration of intravenous contrast. Multiplanar CT image reconstructions and MIPs were obtained to evaluate the vascular anatomy. Carotid stenosis measurements (when applicable) are obtained utilizing NASCET criteria, using the distal internal carotid diameter as the denominator. CONTRAST: 75 mL Isovue 370 COMPARISON: Noncontrast head CT 1347 hours today. FINDINGS: CTA NECK Skeleton: Occasional poor dentition. Lower cervical spine chronic disc and endplate degeneration. No acute osseous abnormality identified. Left maxillary sinus mucosal thickening. Other neck: Negative lung apices. No superior mediastinal lymphadenopathy. Negative thyroid, larynx, pharynx, parapharyngeal spaces, retropharyngeal space and sublingual space. No pharyngeal mass identified. Submandibular glands and parotid glands are within normal limits. Visualized orbit soft tissues are within normal limits. Left side cervical lymph nodes are within normal limits; there is a prominent left level IIa node as seen on sagittal image 179, but this has a maintained normal fatty hilum. There is a large cystic right neck mass situated at the level 2 nodal station between the right carotid space and right sternocleidomastoid muscle. This has simple fluid densitometry (10-15 Hounsfield units) and encompasses 57 x 32 x 42 mm (AP by transverse by CC). No surrounding inflammatory stranding. There does  appear to be mild thickening of the wall of the lesion in some areas (series 501, image 87). Otherwise no right side lymphadenopathy. Aortic arch: 3 vessel arch configuration. No arch atherosclerosis. No great  vessel origin stenosis. Right carotid system: Mildly tortuous proximal right CCA. Soft plaque along the anterior wall of the right CCA at the larynx without stenosis. Circumferential soft plaque at the right carotid bifurcation with less than 50 % stenosis with respect to the distal vessel. Left carotid system: Negative proximal left CCA. Mild soft plaque in the medial left CCA at the level of the larynx, no stenosis. Circumferential soft plaque at the left carotid bifurcation. No stenosis. Mildly tortuous cervical left ICA. Vertebral arteries: Tortuous proximal right subclavian artery without stenosis. Soft plaque at the right vertebral artery origin resulting in moderate to severe stenosis (series 501, image 35). Otherwise negative right vertebral artery to the skullbase. No proximal left subclavian artery stenosis. Normal left vertebral artery origin. Tortuous left V1 segment. The left vertebral artery is mildly dominant and normal to the skullbase. CTA HEAD Posterior circulation: Mildly dominant distal left vertebral artery. Normal PICA vessels and vertebrobasilar junction. No distal vertebral stenosis. No basilar artery abnormality. Normal SCA and right PCA origins. There is mild irregularity and stenosis at the left PCA origin (series 503, image 109). Left posterior communicating artery is present. The right is diminutive or absent. Bilateral PCA branches are within normal limits. No CTA spot sign at the left thalamus region intra-axial hemorrhage. No evidence of associated vascular malformation. Anterior circulation: Both ICA siphons are patent with mild calcified plaque and no stenosis. Normal ophthalmic and left posterior communicating artery origins. Normal carotid termini. Normal MCA and ACA  origins. The anterior communicating artery is ectatic, but without discrete aneurysm. Bilateral ACA branches are normal aside from mild ectasia/tortuosity. Right MCA M1 segment, bifurcation, and most right MCA branches are within normal limits. There is mild to moderate irregularity and stenosis in the proximal aspect of the dominant left posterior sylvian division (series 506, image 16). Left MCA M1 segment, bifurcation, and most left MCA branches are within normal limits. There is mild irregularity and stenosis in the posterior left MCA M2/M3 branches (series 506, image 32). Venous sinuses: Patent. Anatomic variants: Mildly dominant left vertebral artery and mildly fetal type left PCA origin. IMPRESSION: 1. Negative for CTA spot sign or evidence of vascular malformation associated with the left thalamic region hemorrhage. 2. 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 No anterior circulation large vessel stenosis despite plaque, but there is bilateral MCA M2/M3 mild to moderate irregularity and stenosis. 3. Posterior circulation remarkable for moderate to severe stenosis at the right vertebral artery origin, and mild stenosis at the left PCA origin. 4. There is a large cystic right neck mass measuring 5.7 cm. 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 ENT follow-up is necessary. Study discussed by telephone with Dr. Audria Nine on 07/19/2015 at 16:44 . Electronically Signed By: Genevie Ann M.D. On: 07/19/2015 16:47   Ct Angio Chest/abd/pel For Dissection W And/or W/wo  07/20/2015 CLINICAL DATA: Elevated blood pressure in the emergency department yesterday. No history of hypertension. Evaluate for aortic dissection. EXAM: CT ANGIOGRAPHY CHEST, ABDOMEN AND PELVIS TECHNIQUE: Multidetector CT imaging  through the chest, abdomen and pelvis was performed using the standard protocol during bolus administration of intravenous contrast. Multiplanar reconstructed images and MIPs were obtained and reviewed to evaluate the vascular anatomy. CONTRAST: 100 ml Isovue 370 COMPARISON: Neck CTA 07/19/2015 FINDINGS: CTA CHEST FINDINGS Mediastinum: The  pulmonary arteries are well opacified with contrast. There is no evidence of acute pulmonary embolism. Pre contrast images demonstrate no displaced intimal calcifications within the aorta or great vessels. Post-contrast, the aorta enhances normally. There is no evidence of dissection or aneurysm. The heart size is normal. There is no pericardial effusion. There is probable left ventricular hypertrophy. There are no enlarged mediastinal, hilar or axillary lymph nodes. Small hiatal hernia. Lungs/Pleura: There is no pleural effusion.The lungs are clear on the noncontrast images. There is mild dependent atelectasis on the contrasted images. Musculoskeletal/Chest wall: No chest wall lesion or acute osseous findings. Review of the MIP images confirms the above findings. CTA ABDOMEN AND PELVIS FINDINGS Hepatobiliary: The hepatic density is decreased consistent with steatosis. No focal lesions are identified post-contrast. No evidence of gallstones, gallbladder wall thickening or biliary dilatation. Pancreas: Unremarkable. No pancreatic ductal dilatation or surrounding inflammatory changes. Spleen: Normal in size without focal abnormality. Adrenals/Urinary Tract: Both adrenal glands appear normal. Nonobstructing calculi are present in the upper and lower poles of the left kidney. There are left renal cysts measuring up to 3.2 cm in the upper pole. There is a small exophytic lesion in the lower pole the left kidney measuring 8 mm on image 185 and in indeterminate density of 45 HU. This is not imaged prior to contrast administration. The right kidney appears normal. The bladder appears  normal. Stomach/Bowel: No evidence of bowel wall thickening, distention or surrounding inflammatory change. There are moderate diverticular changes within the descending and sigmoid colon. The appendix appears normal. Vascular/Lymphatic: There are no enlarged abdominal or pelvic lymph nodes. Mild aortoiliac atherosclerosis. No evidence of dissection, aneurysm or retroperitoneal hemorrhage. No evidence of large vessel occlusion. Reproductive: The prostate gland and seminal vesicles appear unremarkable. Other: Small umbilical hernia containing only fat. Musculoskeletal: No acute or significant osseous findings. Mild lower lumbar spondylosis. Review of the MIP images confirms the above findings. IMPRESSION: 1. No acute vascular findings in the chest and abdomen or pelvis. No evidence of aortic dissection or aneurysm. Minimal atherosclerosis noted. 2. Probable left ventricular hypertrophy. 3. Left renal cysts, including a small deform it lesion in the lower pole the left kidney. 4. Hepatic steatosis and small hiatal hernia. Electronically Signed By: Richardean Sale M.D. On: 07/20/2015 13:49    Assessment/Plan: Diagnosis: Left thalamic intracerebral hemorrhage Labs and images independently reviewed. Records reviewed and summated above. Stroke Continue secondary stroke prophylaxis and Risk Factor Modification listed below:  Antiplatelet therapy  Blood Pressure Management: Continue current medication with prn's with permisive HTN per primary team Statin Agent  1. Does the need for close, 24 hr/day medical supervision in concert with the patient's rehab needs make it unreasonable for this patient to be served in a less intensive setting? Potentially  2. Co-Morbidities requiring supervision/potential complications: Presumed OSA (continue CPAP, monitor for daytime somnolence and energy), hypokalemia (continue to monitor, replete as necessary), AKI (avoid nephrotoxic meds) 3. Due to safety, disease  management and patient education, does the patient require 24 hr/day rehab nursing? Potentially 4. Does the patient require coordinated care of a physician, rehab nurse, PT (1-2 hrs/day, 5 days/week) and OT (1-2 hrs/day, 5 days/week) to address physical and functional deficits in the context of the above medical diagnosis(es)? Potentially Addressing deficits in the following areas: balance, endurance, locomotion, transferring, toileting and psychosocial support 5. Can the patient actively participate in an intensive therapy program of at least 3 hrs of therapy per day at least 5 days per week? Yes 6. The potential  for patient to make measurable gains while on inpatient rehab is excellent 7. Anticipated functional outcomes upon discharge from inpatient rehab are modified independent and supervision with PT, modified independent with OT, n/a with SLP. 8. Estimated rehab length of stay to reach the above functional goals is: 7-10 days. 9. Does the patient have adequate social supports and living environment to accommodate these discharge functional goals? Yes 10. Anticipated D/C setting: Home 11. Anticipated post D/C treatments: HH therapy and Home excercise program 12. Overall Rehab/Functional Prognosis: good  RECOMMENDATIONS: This patient's condition is appropriate for continued rehabilitative care in the following setting: Would appreciate reevaluation from therapies. If patient continues to have stated deficits, would recommend CIR. Patient has agreed to participate in recommended program. Yes Note that insurance prior authorization may be required for reimbursement for recommended care.  Comment: Rehab Admissions Coordinator to follow up.  Delice Lesch, MD 07/21/2015       Revision History     Date/Time User Provider Type Action   07/21/2015 10:29 AM Ankit Lorie Phenix, MD Physician Sign   07/21/2015 6:33 AM Cathlyn Parsons, PA-C Physician Assistant [facility] Details Report

## 2015-07-22 NOTE — Interval H&P Note (Signed)
Glenn Liu was admitted today to Inpatient Rehabilitation with the diagnosis of left thalamic intracerebral hemorrhage.  The patient's history has been reviewed, patient examined, and there is no change in status.  Patient continues to be appropriate for intensive inpatient rehabilitation.  I have reviewed the patient's chart and labs.  Questions were answered to the patient's satisfaction. The PAPE has been reviewed and assessment remains appropriate.  Glenn Liu 07/22/2015, 11:47 PM

## 2015-07-22 NOTE — Progress Notes (Signed)
[name], RN Rehab Admission Coordinator Signed Physical Medicine and Rehabilitation PMR Pre-admission 07/22/2015 8:57 AM  Related encounter: ED to Hosp-Admission (Discharged) from 07/19/2015 in [place] Collapse All   PMR Admission Coordinator Pre-Admission Assessment  Patient: Glenn Liu is an 55 y.o., male MRN: [id number] DOB: January 04, 1961 Height: 5\' 11"  (180.3 cm) Weight: 125.147 kg (275 lb 14.4 oz)  Insurance Information HMO: PPO: PCP: IPA: 80/20: OTHER: choice plus plan PRIMARY: [place] Policy#: [id number] Subscriber: wife CM Name: Sherlynn Stalls Phone#: [phone] Fax#: [facility] online Pre-Cert#: [id number] Employer: wife's employer approved for 3-4 days after peer to peer with initial denial for admit Benefits: Phone #: [phone] Name: 07/22/15 Eff. 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 right handed male with unremarkable past medical history. 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 Systolic blood pressures 123456.CT of the head showed acute left thalamic hemorrhage 2.0 x 1.8 cm. Placed on a Cardene drip for blood pressure control. CTA of head and neck negative for vascular malformation. No anterior circulation large vessel stenosis. 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 Echocardiogram with ejection fraction 123456 grade 1 diastolic dysfunction. Tolerating a regular diet. Presumed OSA with pulmonary service follow-up trial CPAP and follow-up sleep evaluation as outpatient.  NIH Total: 0    Past Medical History  History reviewed. No pertinent past medical history. Patient has not been to an MD in 68 years  Family History  family history includes Heart attack in his mother.  Prior Rehab/Hospitalizations:  Has the patient had major surgery during 100 days prior to admission? No  Current Medications   Current facility-administered medications:  . stroke: mapping our early stages of recovery book, , Does not apply, Once, Ram Fuller Mandril, MD . acetaminophen (TYLENOL) tablet 650 mg, 650 mg, Oral, Q4H PRN **OR** acetaminophen (TYLENOL) suppository 650 mg, 650 mg, Rectal, Q4H PRN, Ram Fuller Mandril, MD . ALPRAZolam Duanne Moron) tablet 0.5 mg, 0.5 mg, Oral, TID PRN, Donzetta Starch, NP, 0.5 mg at 07/20/15 1917 . amLODipine (NORVASC) tablet 10 mg, 10 mg, Oral, Daily, Donzetta Starch, NP, 10 mg at 07/22/15 1009 . atorvastatin (LIPITOR) tablet 20 mg, 20 mg, Oral, q1800, Donzetta Starch, NP, 20 mg at 07/21/15 1815 . bisacodyl (DULCOLAX) suppository 10 mg, 10 mg, Rectal, Daily PRN, Donzetta Starch, NP . labetalol (NORMODYNE) tablet 200 mg, 200 mg, Oral, TID, Rosalin Hawking,  MD, 200 mg at 07/22/15 1009 . labetalol (NORMODYNE,TRANDATE) injection 10-40 mg, 10-40 mg, Intravenous, Q10 min PRN, Ram [employer] [facility]  Nandigam, MD, 10 mg at 07/21/15 0817 . lisinopril (PRINIVIL,ZESTRIL) tablet 20 mg, 20 mg, Oral, BID, Donzetta Starch, NP, 20 mg at 07/22/15 1009 . pantoprazole (PROTONIX) EC tablet 40 mg, 40 mg, Oral, Daily, Rosalin Hawking, MD, 40 mg at 07/22/15 1009 . 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 States he has a left leg injury 2-3 yrs ago from a fall off a ladder. Makes it difficult to stand up at times. Once up and moving not noticeable.  Home Assistive Devices / Equipment Home Assistive Devices/Equipment: Eyeglasses Home Equipment: None  Prior Device Use: Indicate devices/aids used by the patient prior to current illness, exacerbation or injury? None of the above  Prior Functional Level Prior Function Level of Independence: Independent Comments: reports left leg injury affects him playing sports  Self Care: Did the patient need help bathing, dressing, using the toilet or eating? Independent  Indoor Mobility: Did the patient need assistance with walking from room to room (with or without device)? Independent  Stairs: Did the patient need assistance with internal or external stairs (with or without device)? Independent  Functional Cognition: Did the patient need help planning regular tasks such as shopping or remembering to take medications? Independent  Current Functional Level Cognition  Arousal/Alertness:  Awake/alert Overall Cognitive Status: Impaired/Different from baseline Current Attention Level: Selective Orientation Level: Oriented X4 Safety/Judgement: Decreased awareness of safety, Decreased awareness of deficits General Comments: repeated questions several times throughout session Attention: Focused, Sustained Focused Attention: Appears intact Sustained Attention: Appears intact Memory: Impaired Memory Impairment: Retrieval deficit, Storage deficit Awareness: Appears intact Problem Solving: Appears intact Executive Function: Reasoning Reasoning: Appears intact Safety/Judgment: Appears intact   Extremity Assessment (includes Sensation/Coordination)   Lower Extremity Assessment: Defer to PT evaluation    ADLs  Overall ADL's : Needs assistance/impaired Lower Body Bathing: Min guard (standing with UE supported) Lower Body Dressing: Set up, Supervision/safety, Sit to/from stand Toilet Transfer: Ambulation, Min guard (sit to stand from chair) Armed forces technical officer Details (indicate cue type and reason):  Functional mobility during ADLs: Min guard (for ambulation)    Mobility  Overal bed mobility: Needs Assistance Bed Mobility: Supine to Sit Supine to sit: Supervision Sit to supine: Min assist General bed mobility comments: OOB in chair upon arrival    Transfers  Overall transfer level: Needs assistance Equipment used: None Transfers: Sit to/from Stand Sit to Stand: Min guard, Min assist General transfer comment: min A upon standing to gain balance; no LOB    Ambulation / Gait / Stairs / Emergency planning/management officer  Ambulation/Gait Ambulation/Gait assistance: Min guard, Min assist Ambulation Distance (Feet): 100 Feet (X2) Assistive device: None Gait Pattern/deviations: Step-through pattern, Decreased stride length, Wide base of support (decreased arm swing) General Gait Details: unsteady at times especially with changes in velocity and with head turns; pt unable  to perform head turns R and L without drifting to R and L; min A at times to maintain balance but no LOB; short steps Gait velocity: decreased Gait velocity interpretation: Below normal speed for age/gender Stairs: Yes Stairs assistance: Min assist Stair Management: Two rails, Forwards Number of Stairs: 6  General stair comments: vc for sequnencing and increased safety awareness    Posture / Balance Dynamic Sitting Balance Sitting balance - Comments: slight lean to the R but able to maintain upright position Balance Overall balance assessment: Needs assistance Sitting-balance support: Feet supported Sitting balance-Leahy Scale: Good Sitting balance - Comments: slight lean to the R but able to maintain upright position Standing balance support: During functional activity Standing balance-Leahy Scale: Fair Standing balance comment: requires external support or RW for improved safety with amb High level balance activites: Side stepping, Backward walking, Direction changes, Head turns, Sudden stops High Level Balance Comments: unsteady when performing backward walking, sudden stops and changes in direction/velocity with no LOB  Standardized Balance Assessment Standardized Balance Assessment : Dynamic Gait Index Dynamic Gait Index Level Surface: Mild Impairment Change in Gait Speed: Mild Impairment Gait with Horizontal Head Turns: Mild Impairment Gait with Vertical Head Turns: Mild Impairment Gait and Pivot Turn: Mild Impairment Step Over Obstacle: Mild Impairment Step Around Obstacles: Mild Impairment Steps: Mild Impairment Total Score: 16    Special needs/care consideration BiPAP/CPAP new to CPAP since admission. Pt has been refusing to wear due to comfort. Will need to follow up as an outpt for probable sleep apnea Bowel mgmt: [facility] LBM 4/23 Bladder mgmt: [facility] Diabetic mgmt Hgb A1c 5.3 New diagnosis of HTN this admission Patient does not have a PCP/has not seen MD  in 30 yrs   Previous Home Environment Living Arrangements: Spouse/significant other, Children Lives With: Spouse, Son Available Help at Discharge: Family Type of Home: House Home Layout: One level Home Access: Level entry Bathroom Shower/Tub: Gaffer, Chiropodist: Standard Bathroom Accessibility: Yes How Accessible: Accessible via walker [place]: No  Discharge Living Setting Plans for Discharge Living Setting: Patient's home, Lives with (comment) (wife and adult son) Type of Home at Discharge: House Discharge Home Layout: One level Discharge Home Access: Level entry Discharge Bathroom Shower/Tub: Tub/shower unit, Walk-in shower Discharge Bathroom Toilet: Standard Discharge Bathroom Accessibility: Yes How Accessible: Accessible via walker Does the patient have any problems obtaining your medications?: Yes (Describe) (pt has not seen a PCP in 30 yrs)  Social/Family/Support Systems Patient Roles: Spouse, Parent, Other (Comment) (fulltime Environmental education officer) Contact Information: Naazir Forshay, wife Anticipated Caregiver: wife can take FMLA, adult son, Caleb Anticipated Ambulance person Information: see above Ability/Limitations of Caregiver: wife works two jobs typically gone 630 am until 6 pm daily. son work 29 until 34 daily Caregiver Availability: 24/7 (wife states she will take FMLA form one of her jobs as neede) Discharge Plan Discussed with Primary Caregiver: Yes Is Caregiver In Agreement with Plan?: Yes Does Caregiver/Family have Issues with Lodging/Transportation while Pt is in Rehab?: No  Goals/Additional Needs Patient/Family Goal for Rehab: MOd I to supervision with PT, OT, and SLP Expected length of stay: [facility] 7-10 days Special Service Needs: Pt has no PCP Pt/Family Agrees to Admission and willing to participate: Yes Program Orientation Provided & Reviewed with Pt/Caregiver Including Roles & Responsibilities: Yes  Decrease burden  of Care through IP rehab admission: n/a  Possible need for SNF placement upon discharge:not anticipated  Patient Condition: This patient's condition remains as documented in the consult dated 07/20/15, in which the Rehabilitation Physician determined and documented that the patient's condition is appropriate for intensive rehabilitative care in an inpatient rehabilitation facility. Will admit to inpatient rehab today.  Preadmission Screen Completed By: Cleatrice Burke, 07/22/2015 3:39 PM ______________________________________________________________________  Discussed status with Dr. Posey Pronto on 07/22/2015 at 1540 and  received telephone approval for admission today.  Admission Coordinator: Cleatrice Burke, time [id number] Date 07/22/2015          Cosigned by: Ankit Lorie Phenix, MD at 07/22/2015 3:43 PM  Revision History     Date/Time User Provider Type Action   07/22/2015 3:43 PM Ankit Lorie Phenix, MD Physician Cosign   07/22/2015 3:40 PM [name], RN Rehab Admission Coordinator Sign

## 2015-07-22 NOTE — Progress Notes (Signed)
I have insurance approval and will admit pt to inpt rehab today. [id number]

## 2015-07-22 NOTE — Progress Notes (Signed)
I met with pt at bedside and he does wish for me to pursue a possible inpt rehab admission. I will begin authorization pending insurance approval and bed availability today. [phone]

## 2015-07-22 NOTE — PMR Pre-admission (Signed)
PMR Admission Coordinator Pre-Admission Assessment  Patient: Glenn Liu is an 55 y.o., male MRN: [id number] DOB: [birth year] Height: 5\' 11"  (180.3 cm) Weight: 125.147 kg (275 lb 14.4 oz)              Insurance Information HMO:     PPO:      PCP:      IPA:      80/20:      OTHER: choice plus plan PRIMARY: [place]      Policy#: [id number]      Subscriber: wife CM Name: Sherlynn Stalls    Phone#: [phone]     Fax#: [facility] online Pre-Cert#: [id number]      Employer: wife's employer approved for 3-4 days after peer to peer with initial denial for admit Benefits:  Phone #: [phone]     Name: 07/22/15 Eff. 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 right handed male with unremarkable past medical history.  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 Systolic blood pressures 123456.CT of the head showed acute left thalamic hemorrhage 2.0 x 1.8 cm. Placed on a Cardene drip for blood pressure control. CTA of head and neck negative for vascular malformation. No anterior circulation large vessel stenosis. 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 Echocardiogram with ejection fraction 123456 grade 1 diastolic dysfunction. Tolerating a regular diet. Presumed OSA with pulmonary service follow-up trial CPAP and follow-up sleep evaluation as outpatient.  NIH  Total: 0    Past Medical History  History reviewed. No pertinent past medical history. Patient has not been to an MD in 31 years  Family History  family history includes Heart attack in his mother.  Prior Rehab/Hospitalizations:  Has the patient had major surgery during 100 days prior to admission? No  Current Medications   Current facility-administered medications:  .   stroke: mapping our early stages of recovery book, , Does not apply, Once, Ram Fuller Mandril, MD .  acetaminophen (TYLENOL) tablet 650 mg, 650 mg, Oral, Q4H PRN **OR** acetaminophen (TYLENOL) suppository 650 mg, 650 mg, Rectal, Q4H PRN, Ram Fuller Mandril, MD .  ALPRAZolam Duanne Moron) tablet 0.5 mg, 0.5 mg, Oral, TID PRN, Donzetta Starch, NP, 0.5 mg at 07/20/15 1917 .  amLODipine (NORVASC) tablet 10 mg, 10 mg, Oral, Daily, Ivin Booty L  Biby, NP, 10 mg at 07/22/15 1009 .  atorvastatin (LIPITOR) tablet 20 mg, 20 mg, Oral, q1800, Donzetta Starch, NP, 20 mg at 07/21/15 1815 .  bisacodyl (DULCOLAX) suppository 10 mg, 10 mg, Rectal, Daily PRN, Donzetta Starch, NP .  labetalol (NORMODYNE) tablet 200 mg, 200 mg, Oral, TID, Rosalin Hawking, MD, 200 mg at 07/22/15 1009 .  labetalol (NORMODYNE,TRANDATE) injection 10-40 mg, 10-40 mg, Intravenous, Q10 min PRN, Ram Fuller Mandril, MD, 10 mg at 07/21/15 0817 .  lisinopril (PRINIVIL,ZESTRIL) tablet 20 mg, 20 mg, Oral, BID, Donzetta Starch, NP, 20 mg at 07/22/15 1009 .  pantoprazole (PROTONIX) EC tablet 40 mg, 40 mg, Oral, Daily, Rosalin Hawking, MD, 40 mg at 07/22/15 1009 .  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 States he has a left leg injury 2-3 yrs ago from a fall off a ladder. Makes it difficult to stand up at times. Once up and moving not noticeable.  Home Assistive Devices / Equipment Home Assistive Devices/Equipment: Eyeglasses Home Equipment: None  Prior Device Use: Indicate devices/aids used by the patient prior to current illness, exacerbation or injury? None of the above  Prior Functional Level Prior Function Level of Independence: Independent Comments: reports left leg injury affects him playing sports  Self Care: Did the patient need help bathing, dressing, using the toilet or eating?  Independent  Indoor Mobility: Did the patient need assistance with walking from room to room (with or without device)? Independent  Stairs: Did the patient need assistance with internal or external stairs (with or without device)? Independent  Functional Cognition: Did the patient need help planning regular tasks such as shopping or remembering to take medications? Independent  Current Functional Level Cognition  Arousal/Alertness: Awake/alert Overall Cognitive Status: Impaired/Different from baseline Current Attention Level: Selective Orientation Level: Oriented X4 Safety/Judgement: Decreased awareness of safety, Decreased awareness of deficits General Comments: repeated questions several times throughout session Attention: Focused, Sustained Focused Attention: Appears intact Sustained Attention: Appears intact Memory: Impaired Memory  Impairment: Retrieval deficit, Storage deficit Awareness: Appears intact Problem Solving: Appears intact Executive Function: Reasoning Reasoning: Appears intact Safety/Judgment: Appears intact    Extremity Assessment (includes Sensation/Coordination)    Lower Extremity Assessment: Defer to PT evaluation    ADLs  Overall ADL's : Needs assistance/impaired Lower Body Bathing: Min guard (standing with UE supported) Lower Body Dressing: Set up, Supervision/safety, Sit to/from stand Toilet Transfer: Ambulation, Min guard (sit to stand from chair) Armed forces technical officer Details (indicate cue type and reason):   Functional mobility during ADLs: Min guard (for ambulation)    Mobility  Overal bed mobility: Needs Assistance Bed Mobility: Supine to Sit Supine to sit: Supervision Sit to supine: Min assist General bed mobility comments: OOB in chair upon arrival    Transfers  Overall transfer level: Needs assistance Equipment used: None Transfers: Sit to/from Stand Sit to Stand: Min guard, Min assist General transfer comment: min A upon standing to gain balance; no LOB    Ambulation / Gait / Stairs / Wheelchair Mobility  Ambulation/Gait Ambulation/Gait assistance: Min guard, Min assist Ambulation Distance (Feet): 100 Feet (X2)  Assistive device: None Gait Pattern/deviations: Step-through pattern, Decreased stride length, Wide base of support (decreased arm swing) General Gait Details: unsteady at times especially with changes in velocity and with head turns; pt unable to perform head turns R and L without drifting to R and L; min A at times to maintain balance but no LOB; short steps Gait velocity: decreased Gait velocity interpretation: Below normal speed for age/gender Stairs: Yes Stairs assistance: Min assist Stair Management: Two rails, Forwards Number of Stairs: 6 General stair comments: vc for sequnencing and increased safety awareness    Posture / Balance Dynamic Sitting  Balance Sitting balance - Comments: slight lean to the R but able to maintain upright position Balance Overall balance assessment: Needs assistance Sitting-balance support: Feet supported Sitting balance-Leahy Scale: Good Sitting balance - Comments: slight lean to the R but able to maintain upright position Standing balance support: During functional activity Standing balance-Leahy Scale: Fair Standing balance comment: requires external support or RW for improved safety with amb High level balance activites: Side stepping, Backward walking, Direction changes, Head turns, Sudden stops High Level Balance Comments: unsteady when performing backward walking, sudden stops and changes in direction/velocity with no LOB  Standardized Balance Assessment Standardized Balance Assessment : Dynamic Gait Index Dynamic Gait Index Level Surface: Mild Impairment Change in Gait Speed: Mild Impairment Gait with Horizontal Head Turns: Mild Impairment Gait with Vertical Head Turns: Mild Impairment Gait and Pivot Turn: Mild Impairment Step Over Obstacle: Mild Impairment Step Around Obstacles: Mild Impairment Steps: Mild Impairment Total Score: 16    Special needs/care consideration BiPAP/CPAP new to CPAP since admission. Pt has been refusing to wear due to comfort. Will need to follow up as an outpt for probable sleep apnea Bowel mgmt: [facility] LBM 4/23 Bladder mgmt: [facility] Diabetic mgmt Hgb A1c 5.3 New diagnosis of HTN this admission Patient does not have a PCP/has not seen MD in 30 yrs   Previous Home Environment Living Arrangements: Spouse/significant other, Children  Lives With: Spouse, Son Available Help at Discharge: Family Type of Home: House Home Layout: One level Home Access: Level entry Bathroom Shower/Tub: Gaffer, Chiropodist: Standard Bathroom Accessibility: Yes How Accessible: Accessible via walker [place]: No  Discharge Living  Setting Plans for Discharge Living Setting: Patient's home, Lives with (comment) (wife and adult son) Type of Home at Discharge: House Discharge Home Layout: One level Discharge Home Access: Level entry Discharge Bathroom Shower/Tub: Tub/shower unit, Walk-in shower Discharge Bathroom Toilet: Standard Discharge Bathroom Accessibility: Yes How Accessible: Accessible via walker Does the patient have any problems obtaining your medications?: Yes (Describe) (pt has not seen a PCP in 30 yrs)  Social/Family/Support Systems Patient Roles: Spouse, Parent, Other (Comment) (fulltime Environmental education officer) Contact Information: Kinnith Omdahl, wife Anticipated Caregiver: wife can take FMLA, adult son, Caleb Anticipated Ambulance person Information: see above Ability/Limitations of Caregiver: wife works two jobs typically gone 630 am until 6 pm daily. son work 40 until 69 daily Caregiver Availability: 24/7 (wife states she will take FMLA form one of her jobs as neede) Discharge Plan Discussed with Primary Caregiver: Yes Is Caregiver In Agreement with Plan?: Yes Does Caregiver/Family have Issues with Lodging/Transportation while Pt is in Rehab?: No  Goals/Additional Needs Patient/Family Goal for Rehab: MOd I to supervision with PT, OT, and SLP Expected length of stay: ELOS 7-10 days Special Service Needs: Pt has no PCP Pt/Family Agrees to Admission and willing to participate: Yes Program Orientation Provided & Reviewed with Pt/Caregiver Including Roles  &  Responsibilities: Yes  Decrease burden of Care through IP rehab admission: n/a  Possible need for SNF placement upon discharge:not anticipated  Patient Condition: This patient's condition remains as documented in the consult dated 07/20/15, in which the Rehabilitation Physician determined and documented that the patient's condition is appropriate for intensive rehabilitative care in an inpatient rehabilitation facility. Will admit to inpatient rehab  today.  Preadmission Screen Completed By:  Cleatrice Burke, 07/22/2015 3:39 PM ______________________________________________________________________   Discussed status with Dr. Posey Pronto on 07/22/2015 at  1540 and received telephone approval for admission today.  Admission Coordinator:  Cleatrice Burke, time [id number] Date 07/22/2015

## 2015-07-22 NOTE — Progress Notes (Signed)
RN reviewed rehab process and safety plan with pt and wife. Verbal understanding given. Call bell with in reach, family at bedside.

## 2015-07-23 ENCOUNTER — Inpatient Hospital Stay (HOSPITAL_COMMUNITY): Payer: 59 | Admitting: Occupational Therapy

## 2015-07-23 ENCOUNTER — Inpatient Hospital Stay (HOSPITAL_COMMUNITY): Payer: 59 | Admitting: Speech Pathology

## 2015-07-23 ENCOUNTER — Inpatient Hospital Stay (HOSPITAL_COMMUNITY): Payer: 59 | Admitting: Physical Therapy

## 2015-07-23 DIAGNOSIS — I610 Nontraumatic intracerebral hemorrhage in hemisphere, subcortical: Secondary | ICD-10-CM

## 2015-07-23 DIAGNOSIS — E8809 Other disorders of plasma-protein metabolism, not elsewhere classified: Secondary | ICD-10-CM

## 2015-07-23 DIAGNOSIS — I69393 Ataxia following cerebral infarction: Secondary | ICD-10-CM

## 2015-07-23 LAB — COMPREHENSIVE METABOLIC PANEL
ALBUMIN: 3 g/dL — AB (ref 3.5–5.0)
ALK PHOS: 61 U/L (ref 38–126)
ALT: 14 U/L — ABNORMAL LOW (ref 17–63)
ANION GAP: 12 (ref 5–15)
AST: 29 U/L (ref 15–41)
BUN: 18 mg/dL (ref 6–20)
CO2: 20 mmol/L — AB (ref 22–32)
Calcium: 8.5 mg/dL — ABNORMAL LOW (ref 8.9–10.3)
Chloride: 108 mmol/L (ref 101–111)
Creatinine, Ser: 1.34 mg/dL — ABNORMAL HIGH (ref 0.61–1.24)
GFR calc Af Amer: >60 mL/min (ref >60)
GFR calc non Af Amer: 58 mL/min — ABNORMAL LOW (ref >60)
GLUCOSE: 75 mg/dL (ref 65–99)
Potassium: 4.1 mmol/L (ref 3.5–5.1)
Sodium: 140 mmol/L (ref 135–145)
Total Bilirubin: 1.7 mg/dL — ABNORMAL HIGH (ref 0.3–1.2)
Total Protein: 6.6 g/dL (ref 6.5–8.1)

## 2015-07-23 LAB — CBC WITH DIFFERENTIAL/PLATELET
BASOS ABS: 0.1 10*3/uL (ref 0.0–0.1)
BASOS PCT: 1 %
EOS ABS: 0.2 10*3/uL (ref 0.0–0.7)
Eosinophils Relative: 3 %
HCT: 43.4 % (ref 39.0–52.0)
HEMOGLOBIN: 15.6 g/dL (ref 13.0–17.0)
Lymphocytes Relative: 25 %
Lymphs Abs: 1.9 10*3/uL (ref 0.7–4.0)
MCH: 29.3 pg (ref 26.0–34.0)
MCHC: 35.9 g/dL (ref 30.0–36.0)
MCV: 81.6 fL (ref 78.0–100.0)
MONOS PCT: 9 %
Monocytes Absolute: 0.7 10*3/uL (ref 0.1–1.0)
Neutro Abs: 4.8 10*3/uL (ref 1.7–7.7)
Neutrophils Relative %: 62 %
Platelets: 124 10*3/uL — ABNORMAL LOW (ref 150–400)
RBC: 5.32 MIL/uL (ref 4.22–5.81)
RDW: 13.6 % (ref 11.5–15.5)
WBC: 7.6 10*3/uL (ref 4.0–10.5)

## 2015-07-23 NOTE — Evaluation (Signed)
Speech Language Pathology Assessment and Plan  Patient Details  Name: Glenn Liu MRN: [id number] Date of Birth: [birth year]  SLP Diagnosis: Cognitive Impairments  Rehab Potential: Excellent ELOS: 5 to 7 days    Today's Date: 07/23/2015 SLP Individual Time: 5830-9407 SLP Individual Time Calculation (min): 60 min   Problem List:  Patient Active Problem List   Diagnosis Date Noted  . Hypertensive emergency 07/22/2015  . Hyperlipidemia 07/22/2015  . Obesity 07/22/2015  . Ataxia, post-stroke   . Benign essential HTN   . HLD (hyperlipidemia)   . Anxiety state   . Obstructive sleep apnea   . Morbid obesity ([place])   . OSA (obstructive sleep apnea)   . Hypokalemia   . AKI (acute kidney injury) ([place])   . Gait disturbance, post-stroke   . ICH (intracerebral hemorrhage) ([facility]) - L thalamic due to hypertensive emergency 07/19/2015   Past Medical History: No past medical history on file. Past Surgical History: No past surgical history on file.  Assessment / Plan / Recommendation Clinical Impression Glenn Liu is a 55 y.o. right handed male with unremarkable past medical history.  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 Systolic blood pressures 680.CT of the head showed acute left thalamic hemorrhage 2.0 x 1.8 cm. Placed on a Cardene drip for blood pressure control. CTA of head and neck negative for vascular malformation. No anterior circulation large vessel stenosis. 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 Echocardiogram with ejection fraction 88% grade 1 diastolic dysfunction. Tolerating a regular diet. Presumed OSA with pulmonary  service follow-up trial CPAP and follow-up sleep evaluation as outpatient. Patient transferred to CIR on 07/22/2015. Cognitive evaluation provided which revealed moderate short term memory deficits. MoCA version 7.2 was administered and pt received a score of 24/30 with 22 or above being a normal score. Pt with specific deficits in the areas of delayed recall. Although pt was able to complete addition high level problem solving tasks with only min A verbal cues to Mod I support, pt commented that he "really had to think through the exercises" and they were "fatiguing" to him. This is a change from baseline ability. Pt would also like to practice medication management as he hasn't been on any medicines before and is concerned about the impact that his decreased short term memory might have on the task. Pt's speech was intelligible and he continues to tolerate a regular diet with thin liquids. There were no signs of an oral motor/muscle weaknesses during the evaluation. Pt would benefit from skilled ST to address cognitive ability to increase pt's functional independence and reduce caregiver burden at time of discharge.   Skilled Therapeutic Interventions          Administered a cognitive-linguistic evaluation. Please see above for details. Educated patient in regards to his current cognitive function and goals of skilled ST interventions. He verbalized understanding and agreement.    SLP Assessment  Patient will need skilled Speech Lanaguage Pathology Services during CIR admission    Recommendations  SLP Diet Recommendations: Age appropriate regular solids;Thin Medication Administration: Whole meds with liquid Oral Care Recommendations: Oral care BID Patient destination: Home Follow up Recommendations: Outpatient SLP    SLP Frequency 3 to  5 out of 7 days   SLP Duration  SLP Intensity  SLP Treatment/Interventions 5 to 7 days  Minumum of 1-2 x/day, 30 to 90 minutes  Cognitive  remediation/compensation;Cueing hierarchy;Functional tasks;Internal/external aids    Pain    Prior Functioning Cognitive/Linguistic Baseline: Within functional limits  Lives With: Spouse;Son Available Help at Discharge: Family Vocation: Full time employment  Function:  Cognition Comprehension Comprehension assist level: Follows complex conversation/direction with no assist  Expression   Expression assist level: Expresses complex ideas: With no assist  Social Interaction Social Interaction assist level: Interacts appropriately with others - No medications needed.  Problem Solving Problem solving assist level: Solves complex 90% of the time/cues < 10% of the time  Memory Memory assist level: Recognizes or recalls 90% of the time/requires cueing < 10% of the time   Short Term Goals: Week 1: SLP Short Term Goal 1 (Week 1): Pt will utilize memory strategies and external aides with Min A verbal cues.  SLP Short Term Goal 2 (Week 1): Pt will recall 2 tasks from previous therapy sessions with Min A verbal cues.  SLP Short Term Goal 3 (Week 1): Pt to demonstrate ability to complete complex reasoning tasks with Min A verbal cues.    Refer to Care Plan for Long Term Goals  Recommendations for other services: None  Discharge Criteria: Patient will be discharged from SLP if patient refuses treatment 3 consecutive times without medical reason, if treatment goals not met, if there is a change in medical status, if patient makes no progress towards goals or if patient is discharged from hospital.  The above assessment, treatment plan, treatment alternatives and goals were discussed and mutually agreed upon: by patient  Glenn Liu 07/23/2015, 5:30 PM

## 2015-07-23 NOTE — Care Management Note (Signed)
Inpatient Rehabilitation Center Individual Statement of Services  Patient Name:  Glenn Liu  Date:  07/23/2015  [facility]  Our goal is to provide you with an individualized program based on your diagnosis and situation, designed to meet your specific needs.  With this comprehensive rehabilitation program, you will be expected to participate in at least 3 hours of rehabilitation therapies Monday-Friday, with modified therapy programming on the weekends.  Your rehabilitation program will include the following services:  Physical Therapy (PT), Occupational Therapy (OT), Speech Therapy (ST), 24 hour per day rehabilitation nursing, Therapeutic Recreaction (TR), Neuropsychology, Case Management (Social Worker), Rehabilitation Medicine, Nutrition Services and Pharmacy Services  Weekly team conferences will be held on Wednesday to discuss your progress.  Your Social Worker will talk with you frequently to get your input and to update you on team discussions.  Team conferences with you and your family in attendance may also be held.  Expected length of stay: 5-7 days According to [facility] insurance has only approved for 5 days Overall anticipated outcome: mod/i level  Depending on your progress and recovery, your program may change. Your Social Worker will coordinate services and will keep you informed of any changes. Your Social Worker's name and contact numbers are listed  below.  The following services may also be recommended but are not provided by the [facility] will be made to provide these services after discharge if needed.  Arrangements include referral to agencies that provide these services.  Your insurance has been verified to be:  [facility] Your primary doctor is:  Francena Hanly  Pertinent  information will be shared with your doctor and your insurance company.  Social Worker:  Ovidio Kin, [place] or ([phone]  Information discussed with and copy given to patient by: Elease Hashimoto, 07/23/2015, 2:41 PM

## 2015-07-23 NOTE — Progress Notes (Signed)
55 y.o. right handed male with unremarkable past medical history. Lives with spouse independent prior to admission. One level home with level entry. Patient is employed as a Environmental education officer. Wife works day shift. 3 children in the area also work. 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 Systolic blood pressures 315.CT of the head showed acute left thalamic hemorrhage 2.0 x 1.8 cm. Placed on a Cardene drip for blood pressure control. CTA of head and neck negative for vascular malformation. No anterior circulation large vessel stenosis. 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 Echocardiogram with ejection fraction 17% grade 1 diastolic dysfunction. Tolerating a regular diet. Presumed OSA with pulmonary service follow-up trial CPAP and follow-up sleep evaluation as outpatient  Subjective/Complaints: Patient just finished ADL program, as per OT mainly at supervision level No pain on the right side, denies numbness on the right side  Review of systems negative for chest pain, shortness of breath, nausea, vomiting or diarrhea, good BM today  Objective: Vital Signs: Blood pressure 152/85, pulse 62, temperature 97.9 F (36.6 C), temperature source Oral, resp. rate 18, height 5' 11"  (1.803 m), weight 121.201 kg (267 lb 3.2 oz), SpO2 96 %. No results found. Results for orders placed or performed during the hospital encounter of 07/22/15 (from the past 72 hour(s))  CBC WITH DIFFERENTIAL     Status: Abnormal   Collection Time: 07/23/15  4:52 AM  Result Value Ref Range   WBC 7.6 4.0 - 10.5 K/uL   RBC 5.32 4.22 - 5.81 MIL/uL   Hemoglobin 15.6 13.0 - 17.0 g/dL    HCT 43.4 39.0 - 52.0 %   MCV 81.6 78.0 - 100.0 fL   MCH 29.3 26.0 - 34.0 pg   MCHC 35.9 30.0 - 36.0 g/dL   RDW 13.6 11.5 - 15.5 %   Platelets 124 (L) 150 - 400 K/uL   Neutrophils Relative % 62 %   Neutro Abs 4.8 1.7 - 7.7 K/uL   Lymphocytes Relative 25 %   Lymphs Abs 1.9 0.7 - 4.0 K/uL   Monocytes Relative 9 %   Monocytes Absolute 0.7 0.1 - 1.0 K/uL   Eosinophils Relative 3 %   Eosinophils Absolute 0.2 0.0 - 0.7 K/uL   Basophils Relative 1 %   Basophils Absolute 0.1 0.0 - 0.1 K/uL  Comprehensive metabolic panel     Status: Abnormal   Collection Time: 07/23/15  4:52 AM  Result Value Ref Range   Sodium 140 135 - 145 mmol/L   Potassium 4.1 3.5 - 5.1 mmol/L    Comment: DELTA CHECK NOTED SPECIMEN HEMOLYZED. HEMOLYSIS MAY AFFECT INTEGRITY OF RESULTS.    Chloride 108 101 - 111 mmol/L   CO2 20 (L) 22 - 32 mmol/L   Glucose, Bld 75 65 - 99 mg/dL   BUN 18 6 - 20 mg/dL   Creatinine, Ser 1.34 (H) 0.61 - 1.24 mg/dL   Calcium 8.5 (L) 8.9 - 10.3 mg/dL   Total Protein 6.6 6.5 - 8.1 g/dL   Albumin 3.0 (L) 3.5 - 5.0 g/dL   AST 29 15 - 41 U/L   ALT 14 (L) 17 - 63 U/L   Alkaline Phosphatase 61 38 - 126 U/L   Total  Bilirubin 1.7 (H) 0.3 - 1.2 mg/dL   GFR calc non Af Amer 58 (L) >60 mL/min   GFR calc Af Amer >60 >60 mL/min    Comment: (NOTE) The eGFR has been calculated using the CKD EPI equation. This calculation has not been validated in all clinical situations. eGFR's persistently <60 mL/min signify possible Chronic Kidney Disease.    Anion gap 12 5 - 15     HEENT: normal Cardio: RRR and no murmur Resp: CTA B/L and unlabored GI: BS positive and nontender nondistended Extremity:  Pulses positive and No Edema Skin:   Intact Neuro: Alert/Oriented, Normal Sensory, Abnormal Motor 4+/5 in the right deltoid, biceps, triceps, grip, hip flexor, knee extensor, ankle dorsi flexor and Abnormal FMC Ataxic/ dec FMC Musc/Skel:  Normal Gen. no acute distress   Assessment/Plan: 1. Functional  deficits secondary to left thalamic ICH with right hemiparesis which require 3+ hours per day of interdisciplinary therapy in a comprehensive inpatient rehab setting. Physiatrist is providing close team supervision and 24 hour management of active medical problems listed below. Physiatrist and rehab team continue to assess barriers to discharge/monitor patient progress toward functional and medical goals. FIM:                                  Medical Problem List and Plan: 1.  Altered mental status and right-sided weakness secondary to left thalamic intracerebral hemorrhage secondary to hypertensive crisis- initiate Rehab 2.  DVT Prophylaxis/Anticoagulation: SCDs. Monitor for any signs of DVT 3. Pain Management: Tylenol as needed 4. Hypertension. Norvasc 10 mg daily, labetalol 100 mg 3 times a day, lisinopril 20 mg twice a day. Monitor with increased mobility Filed Vitals:   07/22/15 2157 07/23/15 0700  BP:  152/85  Pulse: 65 62  Temp:  97.9 F (36.6 C)  Resp: 19 18   5. Neuropsych: This patient is capable of making decisions on his own behalf. 6. Skin/Wound Care: Routine skin checks 7. Fluids/Electrolytes/Nutrition: Routine eye and nose with follow-up chemistries             Hypokalemia: Continue to monitor 8. Hyperlipidemia. Lipitor 9. Mood/anxiety. Xanax 0.5 mg 3 times daily as needed. 10. Probable sleep apnea. Plan outpatient evaluation 11. Morbid obesity. Body mass index 38.5 kg. Dietary follow-up, low albumin will start pro-stat 12. Presumed OSA: Continue CPAP 13. AKI: Avoid nephrotoxic meds BMP Latest Ref Rng 07/23/2015 07/22/2015 07/21/2015  Glucose 65 - 99 mg/dL 75 96 110(H)  BUN 6 - 20 mg/dL 18 17 21(H)  Creatinine 0.61 - 1.24 mg/dL 1.34(H) 1.43(H) 1.45(H)  Sodium 135 - 145 mmol/L 140 140 138  Potassium 3.5 - 5.1 mmol/L 4.1 3.4(L) 3.1(L)  Chloride 101 - 111 mmol/L 108 104 104  CO2 22 - 32 mmol/L 20(L) 25 23  Calcium 8.9 - 10.3 mg/dL 8.5(L) 8.6(L) 8.4(L)       LOS (Days) 1 A FACE TO FACE EVALUATION WAS PERFORMED  Halie Gass E 07/23/2015, 7:55 AM

## 2015-07-23 NOTE — Evaluation (Signed)
Occupational Therapy Assessment and Plan  Patient Details  Name: Glenn Liu MRN: [id number] Date of Birth: [birth year]  OT Diagnosis: cognitive deficits, hemiplegia affecting dominant side and muscle weakness (generalized) Rehab Potential: Rehab Potential (ACUTE ONLY): Excellent ELOS: 5-7 days   Today's Date: 07/23/2015 OT Individual Time: 6195-0932 OT Individual Time Calculation (min): 59 min     Problem List:  Patient Active Problem List   Diagnosis Date Noted  . Hypertensive emergency 07/22/2015  . Hyperlipidemia 07/22/2015  . Obesity 07/22/2015  . Ataxia, post-stroke   . Benign essential HTN   . HLD (hyperlipidemia)   . Anxiety state   . Obstructive sleep apnea   . Morbid obesity ([place])   . OSA (obstructive sleep apnea)   . Hypokalemia   . AKI (acute kidney injury) ([place])   . Gait disturbance, post-stroke   . ICH (intracerebral hemorrhage) (HCC) - L thalamic due to hypertensive emergency 07/19/2015    Past Medical History: No past medical history on file. Past Surgical History: No past surgical history on file.  Assessment & Plan Clinical Impression: Patient is a 55 y.o. year old male with recent admission to the hospital on 07/19/2015 with transient altered mental status right-sided weakness while at church giving a sermon. Systolic blood pressures 671.CT of the head showed acute left thalamic hemorrhage 2.0 x 1.8 cm.   Patient transferred to CIR on 07/22/2015 .    Patient currently requires supervision with basic self-care skills secondary to muscle weakness, impaired timing and sequencing, decreased memory and decreased standing balance and decreased balance strategies.  Prior to hospitalization, patient could complete ADLs with independent .  Patient will benefit from skilled intervention to decrease level of assist with basic self-care skills, increase independence with basic self-care skills and increase level of independence with iADL prior to discharge home with care  partner.  Anticipate patient will not need supervision at discharge in home environment  no further OT follow recommended.  OT - End of Session Activity Tolerance: Tolerates 30+ min activity with multiple rests Endurance Deficit: Yes Endurance Deficit Description: Pt reports being fatigued. OT Assessment Rehab Potential (ACUTE ONLY): Excellent Barriers to Discharge: Decreased caregiver support Barriers to Discharge Comments: pt will need to be independent OT Patient demonstrates impairments in the following area(s): Balance;Cognition;Endurance;Motor;Sensory OT Basic ADL's Functional Problem(s): Grooming;Bathing;Dressing;Toileting OT Advanced ADL's Functional Problem(s): Simple Meal Preparation;Light Housekeeping OT Transfers Functional Problem(s): Toilet;Tub/Shower OT Additional Impairment(s): None OT Plan OT Intensity: Minimum of 1-2 x/day, 45 to 90 minutes OT Frequency: 5 out of 7 days OT Duration/Estimated Length of Stay: 5-7 days OT Treatment/Interventions: Balance/vestibular training;Cognitive remediation/compensation;DME/adaptive equipment instruction;Discharge planning;Functional mobility training;Therapeutic Activities;UE/LE Coordination activities;Patient/family education;UE/LE Strength taining/ROM;Therapeutic Exercise;Self Care/advanced ADL retraining;Neuromuscular re-education;Community reintegration OT Self Feeding Anticipated Outcome(s): independent OT Basic Self-Care Anticipated Outcome(s): modified independent OT Toileting Anticipated Outcome(s): independent OT Bathroom Transfers Anticipated Outcome(s): modified independent OT Recommendation Patient destination: Home Follow Up Recommendations: None Equipment Recommended: To be determined   Skilled Therapeutic Intervention Pt worked on Dance movement psychotherapist during session.  He ambulated to the shower with supervision and sat on the shower seat for majority of bathing, standing when rinsing off.  He was able to dry off in  standing as well using wall for support with each UE to dry off his lower legs.  Pt sat on 3:1 for LB dressing tasks before ambulating out to the sink to get his deodorant and to donn pullover shirt.  He demonstrated no LOB during these tasks with reports of slight dizziness when walking,  especially when bending down to pick up dirty towels from the floor.  Had pt ambulate to the gym without assistive device, with head turns.  Noted drifting to the right with looking over the left shoulder but he was able to selfcorrect without LOB.  Ambulated back to room at end of session with pt left sitting in bedside chair with call button and phone within reach.    OT Evaluation Precautions/Restrictions  Precautions Precautions: Fall Restrictions Weight Bearing Restrictions: No  Pain  No report of pain  Home Living/Prior Functioning Home Living Available Help at Discharge: Family Type of Home: House Home Access: Level entry Home Layout: One level Bathroom Shower/Tub: Gaffer, Chiropodist: Standard Bathroom Accessibility: Yes  Lives With: Spouse, Son IADL History Homemaking Responsibilities: Yes Current License: Yes Prior Function Level of Independence: Independent with basic ADLs Vocation: Full time employment Vocation Requirements: Water engineer Leisure: Hobbies-yes (Comment) (golf, working out) Comments: reports left leg injury affects him playing sports ADL  See Function Section of chart  Vision/Perception  Vision- History Baseline Vision/History: Wears glasses Patient Visual Report:  (Pt reports greater blurring of vision but states he is due for an exam) Vision- Assessment Vision Assessment?: Yes Eye Alignment: Within Functional Limits Ocular Range of Motion: Within Functional Limits Alignment/Gaze Preference: Within Defined Limits Tracking/Visual Pursuits: Able to track stimulus in all quads without difficulty Saccades: Within functional  limits Convergence: Within functional limits Visual Fields: No apparent deficits  Cognition Overall Cognitive Status: Impaired/Different from baseline Arousal/Alertness: Awake/alert Year: 2017 Month: April Day of Week: Correct Memory: Impaired Memory Impairment: Retrieval deficit;Storage deficit Immediate Memory Recall: Sock;Blue;Bed Memory Recall: Sock;Blue;Bed Memory Recall Sock: Without Cue Memory Recall Blue: Without Cue Memory Recall Bed: Without Cue Attention: Sustained;Selective Focused Attention: Appears intact Sustained Attention: Appears intact Selective Attention: Appears intact (during selfcare tasks and ADLs) Awareness: Appears intact Safety/Judgment: Appears intact Comments: Pt reports having to concentrate harder during tasks on some occasions as well as difficulty remembering new things.   Sensation Sensation Light Touch: Appears Intact Stereognosis: Appears Intact Hot/Cold: Appears Intact Proprioception: Impaired Detail Proprioception Impaired Details: Impaired RUE Additional Comments: Pt with slightly decreased propioception in the right wrist during testing. Coordination Gross Motor Movements are Fluid and Coordinated: Yes (in BUEs) Fine Motor Movements are Fluid and Coordinated: Yes Motor  Motor Motor: Hemiplegia Motor - Discharge Observations: Mild RLE weakness and decreased control Mobility  Transfers Transfers: Sit to Stand;Stand to Sit Sit to Stand: 5: Supervision;Without upper extremity assist Stand to Sit: 5: Supervision;Without upper extremity assist  Trunk/Postural Assessment  Cervical Assessment Cervical Assessment: Within Functional Limits Thoracic Assessment Thoracic Assessment: Within Functional Limits Lumbar Assessment Lumbar Assessment: Within Functional Limits Postural Control Postural Control: Within Functional Limits  Balance Balance Balance Assessed: Yes Dynamic Sitting Balance Dynamic Sitting - Balance Support: No upper  extremity supported Dynamic Sitting - Level of Assistance: 6: Modified independent (Device/Increase time) Static Standing Balance Static Standing - Balance Support: Right upper extremity supported;Left upper extremity supported Static Standing - Level of Assistance: 5: Stand by assistance Dynamic Standing Balance Dynamic Standing - Level of Assistance: 5: Stand by assistance Extremity/Trunk Assessment RUE Assessment RUE Assessment: Within Functional Limits LUE Assessment LUE Assessment: Within Functional Limits   See Function Navigator for Current Functional Status.   Refer to Care Plan for Long Term Goals  Recommendations for other services: None  Discharge Criteria: Patient will be discharged from OT if patient refuses treatment 3 consecutive times without medical reason, if treatment goals not met,  if there is a change in medical status, if patient makes no progress towards goals or if patient is discharged from hospital.  The above assessment, treatment plan, treatment alternatives and goals were discussed and mutually agreed upon: by patient  Kolsen Choe OTR/L 07/23/2015, 12:47 PM

## 2015-07-23 NOTE — Progress Notes (Signed)
Social Work  Social Work Assessment and Plan  Patient Details  Name: Glenn Liu MRN: [id number] Date of Birth: 12-Aug-1960  Today's Date: 07/23/2015  Problem List:  Patient Active Problem List   Diagnosis Date Noted  . Hypertensive emergency 07/22/2015  . Hyperlipidemia 07/22/2015  . Obesity 07/22/2015  . Ataxia, post-stroke   . Benign essential HTN   . HLD (hyperlipidemia)   . Anxiety state   . Obstructive sleep apnea   . Morbid obesity ([place])   . OSA (obstructive sleep apnea)   . Hypokalemia   . AKI (acute kidney injury) ([place])   . Gait disturbance, post-stroke   . ICH (intracerebral hemorrhage) (HCC) - L thalamic due to hypertensive emergency 07/19/2015   Past Medical History: No past medical history on file. Past Surgical History: No past surgical history on file. Social History:  reports that he has never smoked. He does not have any smokeless tobacco history on file. His alcohol and drug histories are not on file.  Family / Support Systems Marital Status: Married Patient Roles: Spouse, Parent, Other (Comment) Administrator, Civil Service) Spouse/Significant Other: Florentina Jenny [phone] Children: Caleb-son [phone] Other Supports: church members, friends, extended family Anticipated Caregiver: Wife can take FMLA and church members, son sone also Ability/Limitations of Caregiver: Wife and son work-wife gone 6:30am-6:00 pm son works 10;30-5;30 pm Caregiver Availability: Other (Comment) (between all of them 24 hr) Family Dynamics: Close knit family who are supportive and involved with one another. Pt has much support from his congregation and has had numerous visitors already today.  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 Will look toward his wife to make any decisions while here.   Abuse/Neglect Physical Abuse: Denies Verbal Abuse: Denies Sexual Abuse: Denies Exploitation of patient/patient's resources: Denies Self-Neglect: Denies  Emotional Status Pt's affect, behavior adn adjustment status: Pt is motivated to improve and regain his independence. He is ready to change his lifestyle habits and be on a healthier path. He is grateful to be progressing and making the progress he has made and continues to make.  Recent Psychosocial Issues: other health issues was not seeing a MD for, or tkaing any medications Pyschiatric History: No history deferred depression screen due to pt is coping appropriately with his CVA and deficits. 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 Will be very short length of stay so will not be here for neuro-psych to see. Substance Abuse History: No issues  Patient / Family Perceptions, Expectations & Goals Pt/Family understanding of illness & functional limitations: Pt and wife can explain his stroke and deficits. Both are so pleased he is on rehab and the progress he has made thus far. He talks with the MD daily to get a feel for his treatment plan and change in medications Premorbid pt/family roles/activities: Husband, father, pastor, home owner, friend, etc Anticipated changes in roles/activities/participation: resume Pt/family expectations/goals: Pt states: " I will do everything in my power to recover from this."  Wife states: " I will do what he needs, but will need  to return to work also."  [employer]: None Premorbid Home Care/DME Agencies: None Transportation available at discharge: family and  friends Resource referrals recommended: Support group (specify)  Discharge Planning Living Arrangements: Spouse/significant other, Children Support Systems: Spouse/significant other, Children, Water engineer, Social worker community Type of Residence: Private residence Administrator, sports: Multimedia programmer (specify) Sports administrator) Financial Resources: Employment Museum/gallery curator Screen Referred: No Living Expenses: Lives with family Money Management: Spouse, Patient Does the patient have any problems obtaining your medications?: Yes (Describe) (Didn't go to MD) Home Management: Wife does most of home mangement Patient/Family Preliminary Plans: Return home with wife who can take FMLA, son to assist in the [employer] members can fill in the gaps. He will have 24 hr supervision at discharge. According to insurance will only pay for four days here.  Social Work Anticipated Follow Up Needs: HH/OP, Support Group  Clinical Impression Very optimistic gentleman who is so grateful to be here and ready to make progress and regain his independence. Aware of the short length of stay due to insurance coverage. Will focus on Monday-DC. Team feels will be ready and will reach goals they have set for him. Family supportive and involved along with church members. Will make sure he has a PCP prior to discharge for medical follow up.  Elease Hashimoto 07/23/2015, 3:56 PM

## 2015-07-23 NOTE — Evaluation (Signed)
Physical Therapy Assessment and Plan  Patient Details  Name: Glenn Liu MRN: [id number] Date of Birth: 09-29-1960  PT Diagnosis: Abnormality of gait, Difficulty walking, Hemiparesis dominant, Impaired sensation and Muscle weakness Rehab Potential: Excellent ELOS: 5-7 days   Today's Date: 07/23/2015 PT Individual Time: 1425-1535 PT Individual Time Calculation (min): 70 min    Problem List:  Patient Active Problem List   Diagnosis Date Noted  . Hypertensive emergency 07/22/2015  . Hyperlipidemia 07/22/2015  . Obesity 07/22/2015  . Ataxia, post-stroke   . Benign essential HTN   . HLD (hyperlipidemia)   . Anxiety state   . Obstructive sleep apnea   . Morbid obesity ([place])   . OSA (obstructive sleep apnea)   . Hypokalemia   . AKI (acute kidney injury) ([place])   . Gait disturbance, post-stroke   . ICH (intracerebral hemorrhage) ([facility]) - L thalamic due to hypertensive emergency 07/19/2015    Past Medical History: No past medical history on file. Past Surgical History: No past surgical history on file.  Assessment & Plan Clinical Impression: Glenn Liu is a 55 y.o. right handed male with unremarkable past medical history. 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 Systolic blood pressures 268.CT of the head showed acute left thalamic hemorrhage 2.0 x 1.8 cm. Placed on a Cardene drip for blood pressure control. CTA of head and neck negative for vascular malformation. No anterior circulation large vessel stenosis. 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 Echocardiogram with ejection fraction 34% grade 1 diastolic dysfunction.  Tolerating a regular diet. Presumed OSA with pulmonary service follow-up trial CPAP and follow-up sleep evaluation as outpatient. Patient transferred to CIR on 07/22/2015 .   Patient currently requires supervision with mobility secondary to muscle weakness, decreased cardiorespiratoy endurance, ataxia and decreased coordination and decreased standing balance, decreased postural control, hemiplegia and decreased balance strategies.  Prior to hospitalization, patient was independent  with mobility and lived with Spouse, Son in a House home.  Home access is  Level entry.  Patient will benefit from skilled PT intervention to maximize safe functional mobility, minimize fall risk and decrease caregiver burden for planned discharge home with intermittent assist.  Anticipate patient will benefit from follow up OP at discharge.  PT - End of Session Activity Tolerance: Tolerates 30+ min activity with multiple rests Endurance Deficit: Yes Endurance Deficit Description: reports being fatigued from previous sessions, occasional rest breaks between mobility tasks PT Assessment Rehab Potential (ACUTE/IP ONLY): Excellent Barriers to Discharge: Decreased caregiver support PT Patient demonstrates impairments in the following area(s): Balance;Endurance;Motor;Perception;Safety PT Transfers Functional Problem(s): Bed Mobility;Bed to Chair;Car;Furniture;Floor PT Locomotion Functional Problem(s): Ambulation;Stairs PT Plan PT Intensity: Minimum of 1-2 x/day ,45 to 90 minutes PT Frequency: 5 out of 7 days PT Duration Estimated Length of Stay: 5-7 days PT Treatment/Interventions: Ambulation/gait training;Stair training;Therapeutic Activities;Patient/family education;Therapeutic Exercise;UE/LE Strength taining/ROM;UE/LE Coordination activities;Disease management/prevention;Balance/vestibular training;Cognitive remediation/compensation;Community reintegration;Discharge planning;Neuromuscular re-education;Functional mobility  training PT Transfers Anticipated Outcome(s): mod I PT Locomotion Anticipated Outcome(s): mod I in home, S in community PT Recommendation Recommendations for Other Services: Neuropsych consult (follow up OP) Follow Up Recommendations: Outpatient PT Patient destination: Home Equipment Recommended: None recommended by PT  Skilled Therapeutic Intervention Pt received seated in recliner with  family friend present; denies pain and agreeable to treatment. PT initial evaluation performed and completed with S overall as described below. Requires close S for higher level balance challenges. Initiated education regarding increased stroke risk, gradual increase in return to normal activities. Educated pt regarding goals, rehab process, daily schedule and use of call bell and staff assistance before getting up in room. Pt agreeable to all the above. Remained seated in recliner with son and family friend present at completion of session, all needs within reach.   PT Evaluation Precautions/Restrictions Precautions Precautions: Fall Restrictions Weight Bearing Restrictions: No General Chart Reviewed: Yes Response to Previous Treatment: Patient reporting fatigue but able to participate. Family/Caregiver Present:  (church friend present)  Pain  Denies Home Living/Prior Functioning Home Living Available Help at Discharge: Family Type of Home: House Home Access: Level entry Home Layout: One level Bathroom Shower/Tub: Walk-in shower;Tub/shower unit Bathroom Toilet: Standard Bathroom Accessibility: Yes  Lives With: Spouse;Son Prior Function Level of Independence: Independent with basic ADLs Vocation: Full time employment Vocation Requirements: Water engineer Leisure: Hobbies-yes (Comment) (golf, working out) Comments: reports left leg injury affects him playing sports Vision/Perception  Vision - Assessment Eye Alignment: Within Functional Limits Ocular Range of Motion: Within Functional  Limits Alignment/Gaze Preference: Within Defined Limits Tracking/Visual Pursuits: Able to track stimulus in all quads without difficulty Saccades: Within functional limits Convergence: Within functional limits  Cognition Overall Cognitive Status: Impaired/Different from baseline Arousal/Alertness: Awake/alert Attention: Sustained;Selective Focused Attention: Appears intact Sustained Attention: Appears intact Selective Attention: Appears intact (during selfcare tasks and ADLs) Memory: Impaired Memory Impairment: Retrieval deficit;Storage deficit Awareness: Appears intact Safety/Judgment: Appears intact Comments: Pt reports having to concentrate harder during tasks on some occasions as well as difficulty remembering new things.   Sensation Sensation Light Touch: Appears Intact Stereognosis: Appears Intact Hot/Cold: Appears Intact Proprioception: Impaired Detail Proprioception Impaired Details: Impaired RLE Additional Comments: mild impairment in proprioception R hallux Coordination Gross Motor Movements are Fluid and Coordinated: Yes Fine Motor Movements are Fluid and Coordinated: Yes Heel Shin Test: mild impairment RLE (slow speed) however WFL Motor  Motor Motor: Hemiplegia;Ataxia Motor - Skilled Clinical Observations: Mild RLE weakness and ataxia Motor - Discharge Observations: Mild RLE weakness and decreased control  Mobility Bed Mobility Bed Mobility: Supine to Sit;Sit to Supine Supine to Sit: 5: Supervision Supine to Sit Details: Verbal cues for technique Sit to Supine: 5: Supervision Sit to Supine - Details: Verbal cues for technique Transfers Transfers: Yes Sit to Stand: 5: Supervision Sit to Stand Details: Verbal cues for precautions/safety Stand to Sit: 5: Supervision Stand to Sit Details (indicate cue type and reason): Verbal cues for precautions/safety Stand Pivot Transfers: 5: Supervision Stand Pivot Transfer Details: Verbal cues for  precautions/safety Locomotion  Ambulation Ambulation: Yes Ambulation/Gait Assistance: 5: Supervision Ambulation Distance (Feet): 300 Feet Assistive device: None Ambulation/Gait Assistance Details: Verbal cues for precautions/safety;Verbal cues for gait pattern Gait Gait: Yes Gait Pattern: Impaired Gait Pattern: Narrow base of support Gait velocity: 2.57 ft/sec High Level Ambulation High Level Ambulation: Side stepping;Backwards walking;Direction changes;Sudden stops;Head turns Side Stepping: S, no LOB Backwards Walking: S, no LOB Direction Changes: S, no LOB Sudden Stops: S, no LOB Head Turns: S, no LOB Stairs / Additional Locomotion Stairs: Yes Stairs Assistance: 5: Supervision Stair Management Technique: Two rails;Alternating pattern;Forwards Number of Stairs: 12 Height of Stairs: 6 Ramp: 5: Supervision Curb: 5: Supervision Wheelchair Mobility Wheelchair Mobility: No  Trunk/Postural Assessment  Cervical Assessment Cervical Assessment: Within Functional Limits Thoracic Assessment Thoracic Assessment: Within Functional Limits Lumbar Assessment  Lumbar Assessment: Within Functional Limits Postural Control Postural Control: Within Functional Limits  Balance Balance Balance Assessed: Yes Standardized Balance Assessment Standardized Balance Assessment: Berg Balance Test;Dynamic Gait Index;Timed Up and Go Test Berg Balance Test Sit to Stand: Able to stand without using hands and stabilize independently Standing Unsupported: Able to stand safely 2 minutes Sitting with Back Unsupported but Feet Supported on Floor or Stool: Able to sit safely and securely 2 minutes Stand to Sit: Sits safely with minimal use of hands Transfers: Able to transfer safely, minor use of hands Standing Unsupported with Eyes Closed: Able to stand 10 seconds safely Standing Ubsupported with Feet Together: Able to place feet together independently and stand for 1 minute with supervision From  Standing, Reach Forward with Outstretched Arm: Can reach confidently >25 cm (10") From Standing Position, Pick up Object from Floor: Able to pick up shoe safely and easily From Standing Position, Turn to Look Behind Over each Shoulder: Looks behind one side only/other side shows less weight shift Turn 360 Degrees: Able to turn 360 degrees safely but slowly Standing Unsupported, Alternately Place Feet on Step/Stool: Able to complete >2 steps/needs minimal assist Standing Unsupported, One Foot in Front: Able to place foot tandem independently and hold 30 seconds Standing on One Leg: Able to lift leg independently and hold equal to or more than 3 seconds Total Score: 47 Timed Up and Go Test TUG: Normal TUG;Cognitive TUG Normal TUG (seconds): 10.6 Cognitive TUG (seconds): 11 Static Sitting Balance Static Sitting - Balance Support: No upper extremity supported;Feet supported Static Sitting - Level of Assistance: 6: Modified independent (Device/Increase time) Dynamic Sitting Balance Dynamic Sitting - Balance Support: No upper extremity supported Dynamic Sitting - Level of Assistance: 6: Modified independent (Device/Increase time) Static Standing Balance Static Standing - Balance Support: Right upper extremity supported;Left upper extremity supported Static Standing - Level of Assistance: 5: Stand by assistance Static Stance: Eyes closed Static Stance: Eyes Closed: x30 sec S Static Stance: on Foam: min guard, improved to S with time Static Stance: on Foam, Eyes Closed: min guard, significantly increased sway Dynamic Standing Balance Dynamic Standing - Level of Assistance: 5: Stand by assistance Dynamic Standing - Balance Activities: Forward lean/weight shifting;Lateral lean/weight shifting;Reaching for weighted objects;Reaching across midline  Functional Gait Assessment (FGA) Requirements: A marked 6-m (20-ft) walkway that is marked with a 30.48-cm (12-in) width.  _3_ 1. GAIT LEVEL  SURFACE Instructions: Walk at your normal speed from here to the next mark (6 m[20 ft]). Grading: [place] the highest category that applies. (3) Normal-Walks 6 m (20 ft) in less than 5.5 seconds, no assistive devices, good speed, no evidence for imbalance, normal gait pattern, deviates no more than 15.24 cm (6 in) outside of the 30.48-cm (12-in) walkway width. (2) Mild impairment-Walks 6 m (20 ft) in less than 7 seconds but greater than 5.5 seconds, uses assistive device, slower speed, mild gait deviations, or deviates 15.24-25.4 cm (6-10 in) outside of the 30.48-cm (12-in) walkway width. (1) Moderate impairment-Walks 6 m (20 ft), slow speed, abnormal gait pattern, evidence for imbalance, or deviates 25.4-38.1 cm (10-15 in) outside of the 30.48-cm (12-in) walkway width. Requires more than 7 seconds to ambulate 6 m (20 ft). (0) Severe impairment-Cannot walk 6 m (20 ft) without assistance,severe gait deviations or imbalance, deviates greater than 38.1 cm (15 in) outside of the 30.48-cm (12-in) walkway width or reaches and touches the wall.  _2_ 2. CHANGE IN GAIT SPEED Instructions: Begin walking at your normal pace (for 1.5 m [5  ft]). When I tell you "go," walk as fast as you can (for 1.5 m [5 ft]). When I tell you "slow," walk as slowly as you can (for 1.5 m [5 ft]). Grading: [place] the highest category that applies. (3) Normal-Able to smoothly change walking speed without loss of balance or gait deviation. Shows a significant difference in walking speeds between normal, fast, and slow speeds. Deviates no more than 15.24 cm (6 in) outside of the 30.48-cm (12-in) walkway width. (2) Mild impairment-Is able to change speed but demonstrates mild gait deviations, deviates 15.24-25.4 cm (6-10 in) outside of the 30.48-cm (12-in) walkway width, or no gait deviations but unable to achieve a significant change in velocity, or uses an assistive device. (1) Moderate impairment-Makes only minor adjustments to walking  speed, or accomplishes a change in speed with significant gait deviations, deviates 25.4-38.1 cm (10-15 in) outside the 30.48-cm (12-in) walkway width, or changes speed but loses balance but is able to recover and continue walking. (0) Severe impairment-Cannot change speeds, deviates greater than 38.1 cm (15 in) outside 30.48-cm (12-in) walkway width, or loses balance and has to reach for wall or be caught.  _2_ 3. GAIT WITH HORIZONTAL HEAD TURNS Instructions: Walk from here to the next mark 6 m (20 ft) away. Begin walking at your normal pace. Keep walking straight; after 3 steps, turn your head to the right and keep walking straight while looking to the right. After 3 more steps, turn your head to the left and keep walking straight while looking left. Continue alternating looking right and left every 3 steps until you have completed 2 repetitions in each direction. Grading: [place] the highest category that applies. (3) Normal-Performs head turns smoothly with no change in gait. Deviates no more than 15.24 cm (6 in) outside 30.48-cm (12-in) walkway width. (2) Mild impairment-Performs head turns smoothly with slight change in gait velocity (eg, minor disruption to smooth gait path), deviates 15.24-25.4 cm (6-10 in) outside 30.48-cm (12-in) walkway width, or uses an assistive device.  (1) Moderate impairment-Performs head turns with moderate change in gait velocity, slows down, deviates 25.4-38.1 cm (10-15 in) outside 30.48-cm (12-in) walkway width but recovers, can continue to walk. (0) Severe impairment-Performs task with severe disruption of gait (eg, staggers 38.1 cm [15 in] outside 30.48-cm (12-in) walkway width, loses balance, stops, or reaches for wall).  _2_ 4. GAIT WITH VERTICAL HEAD TURNS Instructions: Walk from here to the next mark (6 m [20 ft]). Begin walking at your normal pace. Keep walking straight; after 3 steps, tip your head up and keep walking straight while looking up. After 3 more  steps, tip your head down, keep walking straight while looking down. Continue alternating looking up and down every 3 steps until you have completed 2 repetitions in each direction. Grading: [place] the highest category that applies. (3) Normal-Performs head turns with no change in gait. Deviates no more than 15.24 cm (6 in) outside 30.48-cm (12-in) walkway width. (2) Mild impairment-Performs task with slight change in gait velocity (eg, minor disruption to smooth gait path), deviates 15.24-25.4 cm (6-10 in) outside 30.48-cm (12-in) walkway width or uses assistive device. (1) Moderate impairment-Performs task with moderate change in gait velocity, slows down, deviates 25.4-38.1 cm (10-15 in) outside 30.48-cm (12-in) walkway width but recovers, can continue to walk. (0) Severe impairment-Performs task with severe disruption of gait (eg, staggers 38.1 cm [15 in] outside 30.48-cm (12-in) walkway width, loses balance, stops, reaches for wall).  _2_ 5. GAIT AND PIVOT TURN Instructions:  Begin with walking at your normal pace. When I tell you, "turn and stop," turn as quickly as you can to face the opposite direction and stop. Grading: [place] the highest category that applies. (3) Normal-Pivot turns safely within 3 seconds and stops quickly with no loss of balance. (2) Mild impairment-Pivot turns safely in _3 seconds and stops with no loss of balance, or pivot turns safely within 3 seconds and stops with mild imbalance, requires small steps to catch balance. (1) Moderate impairment-Turns slowly, requires verbal cueing, or requires several small steps to catch balance following turn and stop. (0) Severe impairment-Cannot turn safely, requires assistance to turn and stop.  _2_ 6. STEP OVER OBSTACLE Instructions: Begin walking at your normal speed. When you come to the shoe box, step over it, not around it, and keep walking. Grading: [place] the highest category that applies. (3) Normal-Is able to step over 2  stacked shoe boxes taped together (22.86 cm [9 in] total height) without changing gait speed; no evidence of imbalance. (2) Mild impairment-Is able to step over one shoe box (11.43 cm [4.5 in] total height) without changing gait speed; no evidence of imbalance. (1) Moderate impairment-Is able to step over one shoe box (11.43 cm [4.5 in] total height) but must slow down and adjust steps to clear box safely. May require verbal cueing. (0) Severe impairment-Cannot perform without assistance.  _1_ 7. GAIT WITH NARROW BASE OF SUPPORT Instructions: Walk on the floor with arms folded across the chest, feet aligned heel to toe in tandem for a distance of 3.6 m [12 ft]. The number of steps taken in a straight line are counted for a maximum of 10 steps. Grading: [place] the highest category that applies. (3) Normal-Is able to ambulate for 10 steps heel to toe with no staggering. (2) Mild impairment-Ambulates 7-9 steps. (1) Moderate impairment-Ambulates 4-7 steps. (0) Severe impairment-Ambulates less than 4 steps heel to toe or cannot perform without assistance.  _2_ 8. GAIT WITH EYES CLOSED Instructions: Walk at your normal speed from here to the next mark (6 m [20 ft]) with your eyes closed. Grading: [place] the highest category that applies. (3) Normal-Walks 6 m (20 ft), no assistive devices, good speed, no evidence of imbalance, normal gait pattern, deviates no more than 15.24 cm (6 in) outside 30.48-cm (12-in) walkway width. Ambulates 6 m (20 ft) in less than 7 seconds. (2) Mild impairment-Walks 6 m (20 ft), uses assistive device, slower speed, mild gait deviations, deviates 15.24-25.4 cm (6-10 in) outside 30.48-cm (12-in) walkway width. Ambulates 6 m (20 ft) in less than 9 seconds but greater than 7 seconds. (1) Moderate impairment-Walks 6 m (20 ft), slow speed, abnormal gait pattern, evidence for imbalance, deviates 25.4-38.1 cm (10-15 in) outside 30.48-cm (12-in) walkway width. Requires more than 9 seconds  to ambulate 6 m (20 ft). (0) Severe impairment-Cannot walk 6 m (20 ft) without assistance, severe gait deviations or imbalance, deviates greater than 38.1 cm (15 in) outside 30.48-cm (12-in) walkway width or will not attempt task.  _3_ 9. AMBULATING BACKWARDS Instructions: Walk backwards until I tell you to stop. Grading: [place] the highest category that applies. (3) Normal-Walks 6 m (20 ft), no assistive devices, good speed, no evidence for imbalance, normal gait pattern, deviates no more than 15.24 cm (6 in) outside 30.48-cm (12-in) walkway width. (2) Mild impairment-Walks 6 m (20 ft), uses assistive device, slower speed, mild gait deviations, deviates 15.24-25.4 cm (6-10 in) outside 30.48-cm (12-in) walkway width. (1) Moderate impairment-Walks 6 m (  20 ft), slow speed, abnormal gait pattern, evidence for imbalance, deviates 25.4-38.1 cm (10-15 in) outside 30.48-cm (12-in) walkway width. (0) Severe impairment-Cannot walk 6 m (20 ft) without assistance, severe gait deviations or imbalance, deviates greater than 38.1 cm (15 in) outside 30.48-cm (12-in) walkway width or will not attempt task.  _3_ 10. STEPS Instructions: Walk up these stairs as you would at home (ie, using the rail if necessary). At the top turn around and walk down. Grading: [place] the highest category that applies. (3) Normal-Alternating feet, no rail. (2) Mild impairment-Alternating feet, must use rail. (1) Moderate impairment-Two feet to a stair; must use rail. (0) Severe impairment-Cannot do safely.  TOTAL SCORE: __22____ /30 (MAXIMUM SCORE=30)  Scores of ? 22/30 on the FGA were found to be effective in predicting falls, Sensitivity 85%, Specificity 86% Scores of ? 20/30 on the FGA were optimal to predict older adults residing in community dwellings who would sustain unexplained falls in the next 6 months, Sensitivity 100%, Specificity 76% (Lamar, 2010; aged 51 to 3, Older Adults) [place]: 4.2 points for CVA Augustin Coupe et al,  2010) [facility]: 8 points for Balance and Vestibular Disorders Marjorie Smolder and Augustin Coupe, 2014)   Extremity Assessment  RUE Assessment RUE Assessment: Within Functional Limits LUE Assessment LUE Assessment: Within Functional Limits RLE Assessment RLE Assessment: Exceptions to [facility] (mild strength deficits 4+/5 throughout) LLE Assessment LLE Assessment: Within Functional Limits   See Function Navigator for Current Functional Status.   Refer to Care Plan for Long Term Goals  Recommendations for other services: Other: May benefit from OP neuropsych follow up  Discharge Criteria: Patient will be discharged from PT if patient refuses treatment 3 consecutive times without medical reason, if treatment goals not met, if there is a change in medical status, if patient makes no progress towards goals or if patient is discharged from hospital.  The above assessment, treatment plan, treatment alternatives and goals were discussed and mutually agreed upon: by patient  Luberta Mutter 07/23/2015, 3:45 PM

## 2015-07-24 ENCOUNTER — Inpatient Hospital Stay (HOSPITAL_COMMUNITY): Payer: 59 | Admitting: Physical Therapy

## 2015-07-24 ENCOUNTER — Inpatient Hospital Stay (HOSPITAL_COMMUNITY): Payer: 59 | Admitting: Speech Pathology

## 2015-07-24 ENCOUNTER — Inpatient Hospital Stay (HOSPITAL_COMMUNITY): Payer: 59 | Admitting: Occupational Therapy

## 2015-07-24 NOTE — IPOC Note (Signed)
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   Ataxia, post-stroke   Benign essential HTN   HLD (hyperlipidemia)   Anxiety state   Obstructive sleep apnea   Morbid obesity ([place])     Functional Problem List: Nursing Bowel, Endurance, Medication Management, Perception, Safety  PT Balance, Endurance, Motor, Perception, Safety  OT Balance, Cognition, Endurance, Motor, Sensory  SLP Cognition  TR         Basic ADL's: OT Grooming, Bathing, Dressing, Toileting     Advanced  ADL's: OT Simple Meal Preparation, Light Housekeeping     Transfers: PT Bed Mobility, Bed to Chair, Car, Furniture, Floor  OT Toilet, Tub/Shower     Locomotion: PT Ambulation, Stairs     Additional Impairments: OT None  SLP        TR      Anticipated Outcomes Item Anticipated Outcome  Self Feeding independent  Swallowing      Basic self-care  modified independent  Toileting  independent   Bathroom Transfers modified independent  Bowel/Bladder  Mod I  Transfers  mod I  Locomotion  mod I in home, S in community  Communication     Cognition  Mod I  Pain  2 or less  Safety/Judgment  Mod I   Therapy Plan: PT Intensity: Minimum of 1-2 x/day ,45 to 90 minutes PT Frequency: 5 out of 7 days PT Duration Estimated Length of Stay: 5-7 days OT Intensity: Minimum of 1-2 x/day, 45 to 90 minutes OT Frequency: 5 out of 7 days OT Duration/Estimated Length of Stay: 5-7 days SLP Intensity: Minumum of 1-2 x/day, 30 to 90 minutes SLP Frequency: 3 to 5 out of 7 days SLP Duration/Estimated Length of Stay: 5 to 7 days       Team Interventions: Nursing Interventions Patient/Family Education, Bowel Management, Disease Management/Prevention, Medication Management  PT interventions Ambulation/gait  training, Stair training, Therapeutic Activities, Patient/family education, Therapeutic Exercise, UE/LE Strength taining/ROM, UE/LE Coordination activities, Disease management/prevention, Training and development officer, Cognitive remediation/compensation, Community reintegration, Discharge planning, Neuromuscular re-education, Functional mobility training  OT Interventions Training and development officer, Cognitive remediation/compensation, DME/adaptive equipment instruction, Discharge planning, Functional mobility training, Therapeutic Activities, UE/LE Coordination activities, Patient/family education, UE/LE Strength taining/ROM, Therapeutic Exercise, Self Care/advanced ADL retraining, Neuromuscular re-education, Community reintegration  SLP Interventions Cognitive remediation/compensation, Cueing hierarchy, Functional tasks, Internal/external aids  TR Interventions    SW/CM Interventions Discharge Planning, Psychosocial Support, Patient/Family Education    Team Discharge Planning: Destination: PT-Home ,OT- Home , SLP-Home Projected Follow-up: PT-Outpatient PT, OT-  None, SLP-Outpatient SLP Projected Equipment Needs: PT-None recommended by PT, OT- To be determined, SLP-  Equipment Details: PT- , OT-  Patient/family involved in discharge planning: PT- Patient,  OT-Patient, SLP-Patient  MD ELOS: 5-7 days Medical Rehab Prognosis:  Excellent Assessment: The patient has been admitted for CIR therapies with the diagnosis of left thalamic hemorrhage. The team will be addressing functional mobility, strength, stamina, balance, safety, adaptive techniques and equipment, self-care, bowel and bladder mgt, patient and caregiver education, NMR, visual-perceptual awareness, community reintegration, ego support. Goals have been set at mod I for mobility and self-care/ADL's.    Meredith Staggers, MD, FAAPMR      See Team Conference Notes for weekly updates to the plan of care

## 2015-07-24 NOTE — Progress Notes (Signed)
Physical Therapy Session and Vestibular Assessment Note  Patient Details  Name: Glenn Liu MRN: [id number] Date of Birth: [birth year]  Today's Date: 07/24/2015 PT Individual Time: 1006-1119 PT Individual Time Calculation (min): 73 min   Short Term Goals: Week 1:  PT Short Term Goal 1 (Week 1): =LTG due to estimated length of stay  Skilled Therapeutic Interventions/Progress Updates:    Pt received in recliner; pt reporting his biggest barrier to being more independent is dizziness when up moving around.  Pt describes dizziness as spinning, 5/10 at worst during gait and starting and stopping movements.  He reports it improves the more he moves.  He reports when he first was admitted he would walk "like a robot," (en bloc) with very little head movement.  Educated pt on location of CVA and implications on sensory input to vestibular system.    Pt performed gait in controlled environment x 150' with supervision with intermittent LOB.  Performed floor transfer training and with therapist verbalizing sequence and discussing indications for calling EMS.  Pt return demonstrated full floor > furniture transfer mod I.  Provided pt with handout of OTAGO LE strengthening and balance exercises.  Demonstrated each exercise to pt and had pt return demonstrate all exercises with light UE support with pt noted to have decreased weight shift and decreased single limb stance stability on RLE.  Performed vestibular assessment with recommendations below.  Pt returned to room and left with all items within reach.     1. Vestibular Assessment                           Gross neck ROM WFL  Eye Alignment WFL  Oculomotor ROM WFL  Spontaneous  Nystagmus (room light and vision occluded) None noted in room light  Gaze holding nystagmus(room light and vision occluded) None noted in room light  Smooth pursuit WFL  Saccades WFL  Vergence WFL  VOR Cancellation WFL  Pressure Tests (vision occluded) N/T  VOR slow WFL but  reports feeling dizzy   Head Thrust Test negative  Head Shaking Test (vision occluded) N/T  Dynamic Visual Acuity         N/T  Rt. Hallpike Dix N/T  Lt. Hallpike Dix N/T  Rt. Roll Test N/T  Lt. Roll Test  N/T  MSQ Most provocative movements include: vertical head turns: 1-2/10, horizontal head turns 3-4/10, pivoting to R 3-4/10  Cover-Cross Cover (if indicated) N/T  Head-Neck Differentiation Test (if indicated) N/T   2. Assessment of Gait: Pt assessed with changes in gait speed and while performing head turns during gait.  Pt reporting mild dizziness with head turns to R during gait with pt veering to R side of hallway during R head turn.  Recommend performing full FGA.  Pt also reports feelings of dizziness when starting and stopping gait.    3. Findings:  Patient signs and symptoms consistent with central vertigo and motion sensitivity with possible otolithic organ involvement due to impairments in linear movement.    4. Recommendations for Treatment: Habituation for motion sensitivity with incorporation of vertical and horizontal head turns into gait and dynamic balance activities as well as incorporating sudden stops, starts and changes in gait speed (use of treadmill?).     Therapy Documentation Precautions:  Precautions Precautions: Fall Restrictions Weight Bearing Restrictions: No Vital Signs: Therapy Vitals Pulse Rate: 64 BP: (!) 144/76 mmHg   See Function Navigator for Current Functional Status.   Therapy/Group: Individual  Therapy  Raylene Everts Faucette 07/24/2015, 1:00 PM

## 2015-07-24 NOTE — Progress Notes (Signed)
Speech Language Pathology Daily Session Note  Patient Details  Name: Glenn Liu MRN: [id number] Date of Birth: [birth year]  Today's Date: 07/24/2015 SLP Individual Time: 1400-1500 SLP Individual Time Calculation (min): 60 min  Short Term Goals: Week 1: SLP Short Term Goal 1 (Week 1): Pt will utilize memory strategies and external aides with Mod I.  SLP Short Term Goal 2 (Week 1): Pt will recall 2 tasks from previous therapy sessions with Mod I.  SLP Short Term Goal 3 (Week 1): Pt to demonstrate ability to complete complex problem solving tasks with Mod I.    Skilled Therapeutic Interventions: Skilled treatment session focused on cognitive goals. SLP facilitated session by providing total A for recall of his current medications and their functions, however, patient organized a 4 time per day pill box with Mod I. Patient demonstrates emergent awareness of cognitive deficits, especially in regards to short-term memory deficits and how they can impact his functional independence. Will spend next session focusing on memory compensatory strategies and utilization. Patient left upright in recliner with all needs within reach. Continue with current plan of care.   Function:   Cognition Comprehension Comprehension assist level: Follows complex conversation/direction with no assist  Expression   Expression assist level: Expresses complex ideas: With no assist  Social Interaction Social Interaction assist level: Interacts appropriately with others - No medications needed.  Problem Solving Problem solving assist level: Solves complex problems: With extra time  Memory Memory assist level: Recognizes or recalls 90% of the time/requires cueing < 10% of the time    Pain No/Denies Pain   Therapy/Group: Individual Therapy  Aarion Metzgar 07/24/2015, 4:29 PM

## 2015-07-24 NOTE — Progress Notes (Signed)
Social Work Patient ID: Glenn Liu, male   DOB: [birth year], 55 y.o.   MRN: [id number] Team feels pt will be ready for discharge Monday after am therapies. Wife to be in to go through therapies and then discharge. Discussed follow up therapies and any equipment needs. MD ok with this plan. Work on discharge Monday.

## 2015-07-24 NOTE — Progress Notes (Signed)
54 y.o. right handed male with unremarkable past medical history. Lives with spouse independent prior to admission. One level home with level entry. Patient is employed as a Environmental education officer. Wife works day shift. 3 children in the area also work. 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 Systolic blood pressures 419.CT of the head showed acute left thalamic hemorrhage 2.0 x 1.8 cm. Placed on a Cardene drip for blood pressure control. CTA of head and neck negative for vascular malformation. No anterior circulation large vessel stenosis. 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 Echocardiogram with ejection fraction 62% grade 1 diastolic dysfunction. Tolerating a regular diet. Presumed OSA with pulmonary service follow-up trial CPAP and follow-up sleep evaluation as outpatient  Subjective/Complaints:  Didn't sleep well , "too excited" watching NFL draft  Review of systems negative for chest pain, shortness of breath, nausea, vomiting or diarrhea, good BM today  Objective: Vital Signs: Blood pressure 152/81, pulse 63, temperature 97.6 F (36.4 C), temperature source Oral, resp. rate 18, height 5' 11"  (1.803 m), weight 121.201 kg (267 lb 3.2 oz), SpO2 98 %. No results found. Results for orders placed or performed during the hospital encounter of 07/22/15 (from the past 72 hour(s))  CBC WITH DIFFERENTIAL     Status: Abnormal   Collection Time: 07/23/15  4:52 AM  Result Value Ref Range   WBC 7.6 4.0 - 10.5 K/uL   RBC 5.32 4.22 - 5.81 MIL/uL   Hemoglobin 15.6 13.0 - 17.0 g/dL   HCT 43.4 39.0 - 52.0 %   MCV 81.6 78.0 - 100.0 fL   MCH 29.3 26.0 - 34.0 pg    MCHC 35.9 30.0 - 36.0 g/dL   RDW 13.6 11.5 - 15.5 %   Platelets 124 (L) 150 - 400 K/uL   Neutrophils Relative % 62 %   Neutro Abs 4.8 1.7 - 7.7 K/uL   Lymphocytes Relative 25 %   Lymphs Abs 1.9 0.7 - 4.0 K/uL   Monocytes Relative 9 %   Monocytes Absolute 0.7 0.1 - 1.0 K/uL   Eosinophils Relative 3 %   Eosinophils Absolute 0.2 0.0 - 0.7 K/uL   Basophils Relative 1 %   Basophils Absolute 0.1 0.0 - 0.1 K/uL  Comprehensive metabolic panel     Status: Abnormal   Collection Time: 07/23/15  4:52 AM  Result Value Ref Range   Sodium 140 135 - 145 mmol/L   Potassium 4.1 3.5 - 5.1 mmol/L    Comment: DELTA CHECK NOTED SPECIMEN HEMOLYZED. HEMOLYSIS MAY AFFECT INTEGRITY OF RESULTS.    Chloride 108 101 - 111 mmol/L   CO2 20 (L) 22 - 32 mmol/L   Glucose, Bld 75 65 - 99 mg/dL   BUN 18 6 - 20 mg/dL   Creatinine, Ser 1.34 (H) 0.61 - 1.24 mg/dL   Calcium 8.5 (L) 8.9 - 10.3 mg/dL   Total Protein 6.6 6.5 - 8.1 g/dL   Albumin 3.0 (L) 3.5 - 5.0 g/dL   AST 29 15 - 41 U/L   ALT 14 (L) 17 - 63 U/L   Alkaline Phosphatase 61 38 - 126 U/L   Total Bilirubin 1.7 (H) 0.3 - 1.2 mg/dL   GFR calc non Af Wyvonnia Lora  58 (L) >60 mL/min   GFR calc Af Amer >60 >60 mL/min    Comment: (NOTE) The eGFR has been calculated using the CKD EPI equation. This calculation has not been validated in all clinical situations. eGFR's persistently <60 mL/min signify possible Chronic Kidney Disease.    Anion gap 12 5 - 15     HEENT: normal Cardio: RRR and no murmur Resp: CTA B/L and unlabored GI: BS positive and nontender nondistended Extremity:  Pulses positive and No Edema Skin:   Intact Neuro: Alert/Oriented, Normal Sensory, Abnormal Motor 4+/5 in the right deltoid, biceps, triceps, grip, hip flexor, knee extensor, ankle dorsi flexor and Abnormal FMC Ataxic/ dec FMC Musc/Skel:  Normal Gen. no acute distress   Assessment/Plan: 1. Functional deficits secondary to left thalamic ICH with right hemiparesis which require 3+  hours per day of interdisciplinary therapy in a comprehensive inpatient rehab setting. Physiatrist is providing close team supervision and 24 hour management of active medical problems listed below. Physiatrist and rehab team continue to assess barriers to discharge/monitor patient progress toward functional and medical goals. FIM:       Function - Toileting Toileting steps completed by patient: Adjust clothing prior to toileting, Performs perineal hygiene, Adjust clothing after toileting     Function - Chair/bed transfer Chair/bed transfer method: Ambulatory Chair/bed transfer assist level: Supervision or verbal cues Chair/bed transfer details: Verbal cues for precautions/safety  Function - Locomotion: Wheelchair Will patient use wheelchair at discharge?: No Function - Locomotion: Ambulation Assistive device: No device Max distance: 300 Assist level: Supervision or verbal cues Assist level: Supervision or verbal cues Assist level: Supervision or verbal cues Assist level: Supervision or verbal cues Assist level: Supervision or verbal cues  Function - Comprehension Comprehension: Auditory Comprehension assist level: Follows complex conversation/direction with no assist  Function - Expression Expression: Verbal Expression assist level: Expresses complex ideas: With no assist  Function - Social Interaction Social Interaction assist level: Interacts appropriately with others - No medications needed.  Function - Problem Solving Problem solving assist level: Solves complex problems: Recognizes & self-corrects  Function - Memory Memory assist level: Complete Independence: No helper Patient normally able to recall (first 3 days only): Current season, Location of own room, Staff names and faces, That he or she is in a hospital  Medical Problem List and Plan: 1.  Altered mental status and right-sided weakness secondary to left thalamic intracerebral hemorrhage secondary to  hypertensive crisis- Cont Rehab 2.  DVT Prophylaxis/Anticoagulation: SCDs. Monitor for any signs of DVT 3. Pain Management: Tylenol as needed 4. Hypertension. Norvasc 10 mg daily, labetalol 100 mg 3 times a day, lisinopril 20 mg twice a day. Monitor with increased mobility Filed Vitals:   07/23/15 2035 07/24/15 0556  BP: 154/78 152/81  Pulse: 57 63  Temp:  97.6 F (36.4 C)  Resp:  18   5. Neuropsych: This patient is capable of making decisions on his own behalf. 6. Skin/Wound Care: Routine skin checks 7. Fluids/Electrolytes/Nutrition: Routine eye and nose with follow-up chemistries             Hypokalemia: Continue to monitor 8. Hyperlipidemia. Lipitor 9. Mood/anxiety. Xanax 0.5 mg 3 times daily as needed. 10. Probable sleep apnea. Plan outpatient evaluation 11. Morbid obesity. Body mass index 38.5 kg. Dietary follow-up, low albumin will start pro-stat 12. Presumed OSA: Continue CPAP 13. AKI: Avoid nephrotoxic meds BMP Latest Ref Rng 07/23/2015 07/22/2015 07/21/2015  Glucose 65 - 99 mg/dL 75 96 110(H)  BUN 6 - 20 mg/dL  18 17 21(H)  Creatinine 0.61 - 1.24 mg/dL 1.34(H) 1.43(H) 1.45(H)  Sodium 135 - 145 mmol/L 140 140 138  Potassium 3.5 - 5.1 mmol/L 4.1 3.4(L) 3.1(L)  Chloride 101 - 111 mmol/L 108 104 104  CO2 22 - 32 mmol/L 20(L) 25 23  Calcium 8.9 - 10.3 mg/dL 8.5(L) 8.6(L) 8.4(L)      LOS (Days) 2 A FACE TO FACE EVALUATION WAS PERFORMED  KIRSTEINS,ANDREW E 07/24/2015, 7:53 AM

## 2015-07-24 NOTE — Progress Notes (Signed)
Occupational Therapy Session Note  Patient Details  Name: Glenn Liu MRN: [id number] Date of Birth: May 20, 1960  Today's Date: 07/24/2015 OT Individual Time: 0800-0900 OT Individual Time Calculation (min): 60 min    Short Term Goals: Week 1:  OT Short Term Goal 1 (Week 1): STGs equal to LTGs set a modified independent level.   Skilled Therapeutic Interventions/Progress Updates:    Pt completed bathing and dressing to start session.  Supervision for ambulation around the room and for standing to complete showering.  He dressed sit to stand as well with supervision.  Pt still walking slower with wider BOS but reporting less dizziness with all transitional movement.  Pt practiced tub/shower transfers stepping over the edge of the tub with min assist and without use of UEs.  LOB posteriorly on one occasion requiring min assist to regain.  He worked on dynamic standing balance using foam surface with small weightshifts side to side and rotating without LOB.  He was also able to close his eyes and maintain balance standing on the foam with supervision.  Min assist needed to keep his balance while attempting to walk tandem.  Pt left in bedside chair at end of session with nursing present.    Therapy Documentation Precautions:  Precautions Precautions: Fall Restrictions Weight Bearing Restrictions: No  Pain:  No report of pain  ADL: See Function Navigator for Current Functional Status.   Therapy/Group: Individual Therapy  Duaine Radin OTR/L 07/24/2015, 12:18 PM

## 2015-07-25 ENCOUNTER — Inpatient Hospital Stay (HOSPITAL_COMMUNITY): Payer: 59 | Admitting: Physical Therapy

## 2015-07-25 ENCOUNTER — Inpatient Hospital Stay (HOSPITAL_COMMUNITY): Payer: 59 | Admitting: Speech Pathology

## 2015-07-25 ENCOUNTER — Inpatient Hospital Stay (HOSPITAL_COMMUNITY): Payer: 59 | Admitting: Occupational Therapy

## 2015-07-25 NOTE — Progress Notes (Signed)
55 y.o. right handed male with unremarkable past medical history. Lives with spouse independent prior to admission. One level home with level entry. Patient is employed as a Environmental education officer. Wife works day shift. 3 children in the area also work. 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 Systolic blood pressures 211.CT of the head showed acute left thalamic hemorrhage 2.0 x 1.8 cm. Placed on a Cardene drip for blood pressure control. CTA of head and neck negative for vascular malformation. No anterior circulation large vessel stenosis. 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 Echocardiogram with ejection fraction 15% grade 1 diastolic dysfunction. Tolerating a regular diet. Presumed OSA with pulmonary service follow-up trial CPAP and follow-up sleep evaluation as outpatient  Subjective/Complaints: No issues overnite slept well without meds Some dizziness with standing   Review of systems negative for chest pain, shortness of breath, nausea, vomiting or diarrhea, good BM today  Objective: Vital Signs: Blood pressure 133/67, pulse 61, temperature 97.6 F (36.4 C), temperature source Oral, resp. rate 18, height 5' 11" (1.803 m), weight 121.201 kg (267 lb 3.2 oz), SpO2 97 %. No results found. Results for orders placed or performed during the hospital encounter of 07/22/15 (from the past 72 hour(s))  CBC WITH DIFFERENTIAL     Status: Abnormal   Collection Time: 07/23/15  4:52 AM  Result Value Ref Range   WBC 7.6 4.0 - 10.5 K/uL   RBC 5.32 4.22 - 5.81 MIL/uL   Hemoglobin 15.6 13.0 - 17.0 g/dL   HCT 43.4 39.0 - 52.0 %   MCV 81.6 78.0 - 100.0 fL   MCH  29.3 26.0 - 34.0 pg   MCHC 35.9 30.0 - 36.0 g/dL   RDW 13.6 11.5 - 15.5 %   Platelets 124 (L) 150 - 400 K/uL   Neutrophils Relative % 62 %   Neutro Abs 4.8 1.7 - 7.7 K/uL   Lymphocytes Relative 25 %   Lymphs Abs 1.9 0.7 - 4.0 K/uL   Monocytes Relative 9 %   Monocytes Absolute 0.7 0.1 - 1.0 K/uL   Eosinophils Relative 3 %   Eosinophils Absolute 0.2 0.0 - 0.7 K/uL   Basophils Relative 1 %   Basophils Absolute 0.1 0.0 - 0.1 K/uL  Comprehensive metabolic panel     Status: Abnormal   Collection Time: 07/23/15  4:52 AM  Result Value Ref Range   Sodium 140 135 - 145 mmol/L   Potassium 4.1 3.5 - 5.1 mmol/L    Comment: DELTA CHECK NOTED SPECIMEN HEMOLYZED. HEMOLYSIS MAY AFFECT INTEGRITY OF RESULTS.    Chloride 108 101 - 111 mmol/L   CO2 20 (L) 22 - 32 mmol/L   Glucose, Bld 75 65 - 99 mg/dL   BUN 18 6 - 20 mg/dL   Creatinine, Ser 1.34 (H) 0.61 - 1.24 mg/dL   Calcium 8.5 (L) 8.9 - 10.3 mg/dL   Total Protein 6.6 6.5 - 8.1 g/dL   Albumin 3.0 (L) 3.5 - 5.0 g/dL   AST 29 15 - 41 U/L   ALT 14 (L) 17 - 63 U/L   Alkaline Phosphatase 61 38 - 126 U/L   Total Bilirubin 1.7 (H) 0.3 - 1.2 mg/dL   GFR calc non  Af Amer 58 (L) >60 mL/min   GFR calc Af Amer >60 >60 mL/min    Comment: (NOTE) The eGFR has been calculated using the CKD EPI equation. This calculation has not been validated in all clinical situations. eGFR's persistently <60 mL/min signify possible Chronic Kidney Disease.    Anion gap 12 5 - 15     HEENT: normal Cardio: RRR and no murmur Resp: CTA B/L and unlabored GI: BS positive and nontender nondistended Extremity:  Pulses positive and No Edema Skin:   Intact Neuro: Alert/Oriented, Normal Sensory, Abnormal Motor 4+/5 in the right deltoid, biceps, triceps, grip, hip flexor, knee extensor, ankle dorsi flexor and Abnormal FMC Ataxic/ dec FMC Musc/Skel:  Normal Gen. no acute distress   Assessment/Plan: 1. Functional deficits secondary to left thalamic ICH with right  hemiparesis which require 3+ hours per day of interdisciplinary therapy in a comprehensive inpatient rehab setting. Physiatrist is providing close team supervision and 24 hour management of active medical problems listed below. Physiatrist and rehab team continue to assess barriers to discharge/monitor patient progress toward functional and medical goals. FIM: Function - Bathing Position: Bed Body parts bathed by patient: Right arm, Left arm, Chest, Abdomen, Front perineal area, Buttocks, Right upper leg, Left upper leg, Right lower leg, Left lower leg, Back Assist Level: Supervision or verbal cues  Function- Upper Body Dressing/Undressing What is the patient wearing?: Pull over shirt/dress Pull over shirt/dress - Perfomed by patient: Thread/unthread right sleeve, Thread/unthread left sleeve, Put head through opening, Pull shirt over trunk Function - Lower Body Dressing/Undressing What is the patient wearing?: Socks, Pants, Underwear, Shoes Underwear - Performed by patient: Thread/unthread right underwear leg, Thread/unthread left underwear leg, Pull underwear up/down Pants- Performed by patient: Thread/unthread right pants leg, Thread/unthread left pants leg, Pull pants up/down, Fasten/unfasten pants Socks - Performed by patient: Don/doff right sock, Don/doff left sock Shoes - Performed by patient: Don/doff right shoe, Don/doff left shoe, Fasten right, Fasten left Assist for lower body dressing: Supervision or verbal cues  Function - Toileting Toileting steps completed by patient: Adjust clothing prior to toileting, Performs perineal hygiene, Adjust clothing after toileting Toileting Assistive Devices: Grab bar or rail     Function - Chair/bed transfer Chair/bed transfer method: Ambulatory Chair/bed transfer assist level: Supervision or verbal cues Chair/bed transfer details: Verbal cues for precautions/safety  Function - Locomotion: Wheelchair Will patient use wheelchair at  discharge?: No Function - Locomotion: Ambulation Assistive device: No device Max distance: 300 Assist level: Supervision or verbal cues Assist level: Supervision or verbal cues Assist level: Supervision or verbal cues Assist level: Supervision or verbal cues Assist level: Supervision or verbal cues  Function - Comprehension Comprehension: Auditory Comprehension assist level: Follows complex conversation/direction with no assist  Function - Expression Expression: Verbal Expression assist level: Expresses complex ideas: With no assist  Function - Social Interaction Social Interaction assist level: Interacts appropriately with others - No medications needed.  Function - Problem Solving Problem solving assist level: Solves complex problems: Recognizes & self-corrects  Function - Memory Memory assist level: Recognizes or recalls 90% of the time/requires cueing < 10% of the time Patient normally able to recall (first 3 days only): Current season, Location of own room, Staff names and faces, That he or she is in a hospital  Medical Problem List and Plan: 1.  Altered mental status and right-sided weakness secondary to left thalamic intracerebral hemorrhage secondary to hypertensive crisis- Cont Rehab 2.  DVT Prophylaxis/Anticoagulation: SCDs. Monitor for any signs of DVT 3.  Pain Management: Tylenol as needed 4. Hypertension. Norvasc 10 mg daily, labetalol 100 mg 3 times a day, lisinopril 20 mg twice a day. Monitor with increased mobility, check orthostatic today Filed Vitals:   07/24/15 2115 07/25/15 0556  BP: 142/85 133/67  Pulse:  61  Temp:  97.6 F (36.4 C)  Resp:  18   5. Neuropsych: This patient is capable of making decisions on his own behalf. 6. Skin/Wound Care: Routine skin checks 7. Fluids/Electrolytes/Nutrition: Routine eye and nose with follow-up chemistries             Hypokalemia: Continue to monitor 8. Hyperlipidemia. Lipitor 9. Mood/anxiety. Xanax 0.5 mg 3 times  daily as needed. 10. Probable sleep apnea. Plan outpatient evaluation 11. Morbid obesity. Body mass index 38.5 kg. Dietary follow-up, low albumin will start pro-stat 12. Presumed OSA: Continue CPAP 13. AKI: Avoid nephrotoxic meds BMP Latest Ref Rng 07/23/2015 07/22/2015 07/21/2015  Glucose 65 - 99 mg/dL 75 96 110(H)  BUN 6 - 20 mg/dL 18 17 21(H)  Creatinine 0.61 - 1.24 mg/dL 1.34(H) 1.43(H) 1.45(H)  Sodium 135 - 145 mmol/L 140 140 138  Potassium 3.5 - 5.1 mmol/L 4.1 3.4(L) 3.1(L)  Chloride 101 - 111 mmol/L 108 104 104  CO2 22 - 32 mmol/L 20(L) 25 23  Calcium 8.9 - 10.3 mg/dL 8.5(L) 8.6(L) 8.4(L)      LOS (Days) 3 A FACE TO FACE EVALUATION WAS PERFORMED  Keeli Roberg E 07/25/2015, 7:58 AM

## 2015-07-25 NOTE — Progress Notes (Signed)
Pt.placed CPAP on himself prior to my arrival, uses one @ home, RN aware.

## 2015-07-25 NOTE — Progress Notes (Deleted)
Helped pt. place CPAP on, M/FFM with room air @ 8 cmh20, humidity filled, RN aware, tolerating well, RT to monitor.

## 2015-07-25 NOTE — Progress Notes (Signed)
Speech Language Pathology Daily Session Note  Patient Details  Name: Glenn Liu MRN: [id number] Date of Birth: 22-Mar-1961  Today's Date: 07/25/2015 SLP Individual Time: 1430-1500 SLP Individual Time Calculation (min): 30 min  Short Term Goals: Week 1: SLP Short Term Goal 1 (Week 1): Pt will utilize memory strategies and external aides with Mod I.  SLP Short Term Goal 2 (Week 1): Pt will recall 2 tasks from previous therapy sessions with Mod I.  SLP Short Term Goal 3 (Week 1): Pt to demonstrate ability to complete complex problem solving tasks with Mod I.    Skilled Therapeutic Interventions: Skilled ST session focused cognitive goals. SLP facilitated by reviewing memory strategies. Handout left with patient. He voice understanding of strategies and independently generated ways to use each strategy within his home and work environment. SLP further facilitated session by providing Mod I cues for questions after auditory only information was given. Pt answered questions with 90% accuracy. Pt with questions regarding return to work and ways cognitive deficits might impact his return to work. Pt left in chair with visitor present and all needs within reach. Continue current plan of care.   Function:   Cognition Comprehension Comprehension assist level: Follows complex conversation/direction with extra time/assistive device  Expression   Expression assist level: Expresses complex ideas: With no assist  Social Interaction Social Interaction assist level: Interacts appropriately with others - No medications needed.  Problem Solving Problem solving assist level: Solves complex problems: With extra time  Memory Memory assist level: Recognizes or recalls 90% of the time/requires cueing < 10% of the time    Pain    Therapy/Group: Individual Therapy  Haruo Stepanek 07/25/2015, 3:16 PM

## 2015-07-25 NOTE — Progress Notes (Signed)
Pt. Places CPAP on/off by himself.

## 2015-07-25 NOTE — Progress Notes (Signed)
Physical Therapy Session Note  Patient Details  Name: Glenn Liu MRN: [id number] Date of Birth: Dec 02, 1960  Today's Date: 07/25/2015 PT Individual Time: 0800-0900 PT Individual Time Calculation (min): 60 min   Short Term Goals: Week 1:  PT Short Term Goal 1 (Week 1): =LTG due to estimated length of stay  Skilled Therapeutic Interventions/Progress Updates:    Patient received sitting up in recliner with MD present for assessment. Patient agreeable to PT with trade off from MD. Patient reports mild dizziness with sit<>stand. Orthostatic vitals assessed per MD request. Supine: 167/83 Sitting: 159/85, Standing: 144/78. HR remained 66bpm.   Dynamic balance training.  Weave through 12 cones x 3 with min A from PT for safety and cues to focus gaze with turns to prevent increased dizziness.  Gait with head turns R and L for 74ft x 4. Min A from PT, significant decrease cervical ROM noted due to dizziness with head turns. PT provided instructed for stabilzed gaze with head turn to decrease effects of dizziness.   Qped  On mat table, arm extension x 10 BUE, Qped on mat table hip extension x 10 BLE. Cues for increased ROM as tolerated and improved core activation.   Floor transfer with push from mat table x 1. Education on proper technique through sidelying to quadruped. then half kneeling to stand. PT provided close supervision A and moderate verbal instruction for sequencing and UE/LE positioning. Patient able to recall safety guidelines for when to perform transfer after 10 minutes.  Mild increase in dizziness noted throughout transfer.   Gait training in controlled environment, 37ft x 2 and 124ft x2 with close Supervision A from PT. Gait training on uneven surface including paved and cement sidewalk x 274ft with close supervision A from PT. Patient noted to have decreased trunk rotation with all gait training, and decreased step length on uneven surface compared to controlled environment.  Stair  training x 6 steps with 1 UE support and close supervision A from PT. Patient performed with step-over-step technique with mild decrease in control with leading with LLE and weight bearing on RLE  Patient returned to room and left sitting in recliner with call bell within reach.     Therapy Documentation Precautions:  Precautions Precautions: Fall Restrictions Weight Bearing Restrictions: No General:   Vital Signs: Therapy Vitals Temp: 97.6 F (36.4 C) Temp Source: Oral Pulse Rate: 61 Resp: 18 BP: 133/67 mmHg Patient Position (if appropriate): Lying Oxygen Therapy SpO2: 97 % O2 Device: Not Delivered Pain: Pain Assessment Pain Assessment: No/denies pain Pain Score: 0-No pain  See Function Navigator for Current Functional Status.   Therapy/Group: Individual Therapy  Lorie Phenix 07/25/2015, 9:24 AM

## 2015-07-25 NOTE — Progress Notes (Signed)
Occupational Therapy Session Note  Patient Details  Name: Glenn Liu MRN: [id number] Date of Birth: 03-15-1961  Today's Date: 07/25/2015 OT Individual Time: 1004-1105 OT Individual Time Calculation (min): 61 min    Short Term Goals: Week 1:  OT Short Term Goal 1 (Week 1): STGs equal to LTGs set a modified independent level.   Skilled Therapeutic Interventions/Progress Updates:    Pt completed shower and dressing with overall supervision level.  Pt still with decreased memory at times, forgetting to pick something up off the floor after stating he would get it after he finished his shower.  Min questioning cueing to recall.  Pt still with "dizziness" 5/10 today during self care tasks and neuromuscular re-education.  Had pt ambulate to the therapy gym.  Pt has slower than normal gait with decreased head movements.  Worked on divided attention by walking and tossing a beach ball.  Increased complexity as well by having pt toss the ball while walking and count back from 100 by 3s.  Occasional error with subtraction on 2 occasions requiring questioning cueing.  Pt with no LOB with either of these tasks.  Used Biedex for hip and ankle strategies with limits of stability program.  Pt scored 46% on easiest level and 26% on hardest skill level.  He was able to ambulate back to his room at end of session with transfer to the recliner.  Pt reporting that he feels his dizziness may be secondary to meds.  VOR checked for vestibular function and pt exhibited this WFLs.    Therapy Documentation Precautions:  Precautions Precautions: Fall Restrictions Weight Bearing Restrictions: No  Pain: Pain Assessment Pain Assessment: No/denies pain Pain Score: 0-No pain ADL: See Function Navigator for Current Functional Status.   Therapy/Group: Individual Therapy  [name] OTR/L 07/25/2015, 12:44 PM

## 2015-07-25 NOTE — Progress Notes (Signed)
Occupational Therapy Session Note  Patient Details  Name: Glenn Liu MRN: [id number] Date of Birth: Aug 16, 1960  Today's Date: 07/25/2015 OT Individual Time: [id number] OT Individual Time Calculation (min): 41 min    Skilled Therapeutic Interventions/Progress Updates:    Pt ambulated to the gym and then carried the BOSU to the [place] gym for work on balance and divided attention.  He scored 100% accuracy with 2 second red lights while not pushing the green lights.  Transitioned to having to push red lights, leave green ones alone and state 3 digit numbers on t scope.  He was 100% accurate on stating numbers and not pushing the green lights, with 95% accuracy on knocking out the 2 second flashing red lights,  Had pt completing same task while standing on foam pad as well.  No LOB noted with similar results.  Transitioned to having him standing on the BOSU.  Max assist to get his feet positioned and maintain static balance.  Completed one set of pushing out the red lights while maintaining balance on BOSU.  LOB to the right noted X 1 with pt using UEs to correct.  Finished treatment by having pt just balance on the BOSU while performing small head movements as well as completing partial squats for reaching.  Min assist needed on one occasion secondary to LOB.  Pt ambulated back to the room while tossing a small ball up and catching it during mobility.  No LOB noted.   Therapy Documentation Precautions:  Precautions Precautions: Fall Restrictions Weight Bearing Restrictions: No  Pain: Pain Assessment Pain Assessment: No/denies pain ADL: See Function Navigator for Current Functional Status.   Therapy/Group: Individual Therapy  Karron Goens OTR/L 07/25/2015, 4:24 PM

## 2015-07-26 ENCOUNTER — Inpatient Hospital Stay (HOSPITAL_COMMUNITY): Payer: 59 | Admitting: Occupational Therapy

## 2015-07-26 ENCOUNTER — Inpatient Hospital Stay (HOSPITAL_COMMUNITY): Payer: 59 | Admitting: Physical Therapy

## 2015-07-26 NOTE — Procedures (Signed)
Pt does not wish to wear cpap tonight will inform RT if anything changes. 

## 2015-07-26 NOTE — Progress Notes (Signed)
Occupational Therapy Session Note  Patient Details  Name: Godfrey Mccreery MRN: [id number] Date of Birth: Aug 04, 1960  Today's Date: 07/26/2015 OT Individual Time: 1300-1430 OT Individual Time Calculation (min): 90 min    Short Term Goals: Week 1:  OT Short Term Goal 1 (Week 1): STGs equal to LTGs set a modified independent level.   Skilled Therapeutic Interventions/Progress Updates: Patient completed skilled OT as follows:    Pt. participatedi n skilled OT as follows:    Functional mobility for home skills such as sweeping floor and cleaning kitchen without an assistive device (dynamic standing balance and mobility= good - voiced 1 episode of "slight light headedness" during the 90 minute of continued active mobility and kitchen skills.  Patient had already dressed for the day and wanted to wait until Monday morning when his wife arrives for bathing and dressing.   He stated that he really feels he will not need a shower seat for his shower.    Also he prefers to use his shower at home rather than the tub shower as he stated the floor in the tub shower is a little slippery.  He was able to complete the homemaking tasks with modified independence.     As well, he was able to demonstrate focus, sustained, and divided attention with this clinician as he completed making a melted cheese sandwich with modified independence.   As well, he exhibited good memory skills to describe his kitchen and bathroom at home with modified independence.   As well, he stated, "Putting this dishes away from this dishwasher is making me really think and figure out where to put them.   This is good for my memory."       Therapy Documentation Precautions:  Precautions Precautions: Fall Restrictions Weight Bearing Restrictions: No  Pain:denied    See Function Navigator for Current Functional Status.   Therapy/Group: Individual Therapy  Herschell Dimes 07/26/2015, 1:51 PM

## 2015-07-26 NOTE — Discharge Summary (Signed)
Discharge summary job # [id number]

## 2015-07-26 NOTE — Discharge Summary (Signed)
NAMELULA, PORTOCARRERO NO.:  [id number]  MEDICAL RECORD NO.:  [id number]  LOCATION:  [id number]                        FACILITY:  [facility]  PHYSICIAN:  Charlett Blake, M.D.DATE OF BIRTH:  20-Aug-1960  DATE OF ADMISSION:  07/22/2015 DATE OF DISCHARGE:  07/27/2015                              DISCHARGE SUMMARY   DISCHARGE DIAGNOSES: 1. Left thalamic intracerebral hemorrhage. 2. Sequential compressive devices for deep vein thrombosis     prophylaxis. 3. Hypertension. 4. Hyperlipidemia. 5. Probable sleep apnea. 6. Morbid obesity.  HISTORY OF PRESENT ILLNESS:  This is a 55 year old right-handed male with morbid obesity.  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  Presented on July 19, 2015, with transient altered mental status, right-sided weakness while at church.  Systolic blood pressure 123456.  CT of the head showed acute left thalamic hemorrhage 2.0 x 1.8 cm.  Placed on Cardene drip for blood pressure control.  CTA of the head and neck negative for vascular malformation.  No anterior circulation or large vessel stenosis. 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  No pharyngeal primary tumor identified.  CTA of chest without evidence of aortic dissection.  Echocardiogram with ejection fraction of 123456, grade 1 diastolic dysfunction.  Tolerating a regular diet. Physical and occupational therapy ongoing.  The patient was admitted for a comprehensive rehab program.  PAST MEDICAL HISTORY:  See discharge diagnoses.  SOCIAL HISTORY:  Lives with spouse, independent prior to admission.  FUNCTIONAL STATUS UPON ADMISSION TO REHAB SERVICES:  Minimal assist ambulation, minimal assist to navigate stairs.  Supervision sit to stand, min to mod assist activities of daily living.  PHYSICAL EXAMINATION:  VITAL SIGNS:   Blood pressure 154/78, pulse 76, temperature 98, respirations 20. GENERAL:  This was an alert male, oriented x3. LUNGS:  Clear to auscultation without wheeze. CARDIAC:  Regular rate and rhythm.  No murmur. ABDOMEN:  Soft, nontender, obese, good bowel sounds.  REHABILITATION HOSPITAL COURSE:  The patient was admitted to Inpatient Rehab Services with therapies initiated on a 3-hour daily basis consisting of physical therapy, occupational therapy, speech therapy, and rehabilitation nursing.  The following issues were addressed during the patient's rehabilitation stay.  Pertaining to Mr. Loporto' left thalamic hemorrhage remained stable, he would follow up Neurology Services.  SCDs for DVT prophylaxis.  Blood pressures controlled with Norvasc, labetalol and lisinopril.  He would follow up with a primary MD.  Lipitor ongoing for hyperlipidemia.  Morbid obesity, body mass index 38.5.  It was recommended to his outpatient primary care physician probable sleep apnea, consider CPAP and sleep study.  Hospital course, mild renal insufficiency 1.34 and monitored.  The patient received weekly collaborative interdisciplinary team conferences to discuss estimated length of stay, family teaching, and any barriers to his discharge.  The patient could navigate an obstacle course with supervision.  Floor transfers modified independent.  Ambulating 300 feet close supervision.  Ambulation through uneven surfaces, including __________ with supervision.  He could gather his belongings for activities of daily living and homemaking.  No loss of balance noted.  Speech Therapy followup for any higher cognitive level deficits.  He voiced full understanding of any strategies needed.  He was independent generating ways to use every strategy.  He answered questions 90% accurate.  Working with comprehensive assist level, following complex conversation with good interaction.  The patient discharged to home.  DISCHARGE  MEDICATIONS: 1. Xanax 0.5 mg p.o. t.i.d. as needed. 2. Norvasc 10 mg p.o. daily. 3. Lipitor 20 mg p.o. daily. 4. Labetalol 200 mg p.o. t.i.d. 5. Lisinopril 20 mg p.o. b.i.d.   DIET:  Regular.  FOLLOWUP:  Dr. Alysia Penna at the [facility] as directed; Dr. Erlinda Hong of Neurology Service 1 month call for appointment; Dr. Lucita Lora for special instructions; follow up outpatient primary care provider for outpatient sleep study question, need for CPAP for probable obstructive sleep apnea.     Lauraine Rinne, P.A.   ______________________________ Charlett Blake, M.D.    DA/MEDQ  D:  07/26/2015  T:  07/26/2015  Job:  [id number]  cc:   Dr. Rosalin Hawking Dr. Lucita Lora

## 2015-07-26 NOTE — Progress Notes (Signed)
Physical Therapy Session Note  Patient Details  Name: Glenn Liu MRN: [id number] Date of Birth: 02-Aug-1960  Today's Date: 07/26/2015 PT Individual Time: 0801-0902 and [id number]  PT Individual Time Calculation (min): 61 min and 30 minutes Short Term Goals: Week 1:  PT Short Term Goal 1 (Week 1): =LTG due to estimated length of stay  Skilled Therapeutic Interventions/Progress Updates:    Treatment 1: Pt received in recliner awaiting PT arrival. Pt agreeable to PT denying c/o pain at rest. PT & pt discussed functional activities that he feels he still has difficulty with. Determined pt would like to practice bed mobility, car transfer & stairs more. Pt completed all sit<>stand transfers with mod I during session. Pt ambulated without AD with distant supervision fade to Mod I during session; pt demonstrates decreased trunk movement during gait. PT adjusted car height to simulate pt's car & pt able to transfer in/out of car with mod I by stepping into car. PT also educated pt on way to sit & then turn BLE in/out of car if needed & pt practiced task in this manner with Mod I. In rehab apartment pt able to perform bed mobility, supine<>sit & rolling with Mod I. Completed stair training with 1 rail with pt negotiating 4 steps x 2 consecutive trials with reciprocal pattern & mod I. 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 Pt required supervision to do so, as pt with decreased eccentric control and unsteadiness with descending stairs. Educated pt on ability to ascend stairs leading with stronger leg & descend with weaker leg; pt reports L feels weaker 2/2 old injury. Pt played Wii golf with distant supervision fade to mod I without AD and without LOB. Activity focused on increasing core rotation, standing endurance, head turns with activity to address dizziness, and balance. Pt reported no c/o dizziness with task. During session  pt reported his dizziness is worse in mornings & fades as the day progresses. Pt also reported LLE muscle soreness 2/2 previous therapies. At end of session pt ambulated back to room & was left in recliner with all needs within reach.  Treatment 2: Pt received in bed, denying c/o pain & agreeable to PT. Pt transferred to sitting EOB & donned shoes independently. Pt ambulated off of unit, down to lobby & outside. Pt able to ambulate over inclines/declines, negotiate 6 steps with L ascending rail, grass, and hills with Mod I. Educated pt on heart healthy diet, exercise regimen, and ways to decrease stress & pt voiced understanding. Pt reported he still has difficulty with thinking, & educated pt to discuss continued speech therapy with SLP tomorrow. Also educated pt on vestibular outpatient after d/c. At end of session pt left in recliner in room with all needs within reach.   Therapy Documentation Precautions:  Precautions Precautions: Fall Restrictions Weight Bearing Restrictions: No  Pain: Pain Assessment Pain Assessment: No/denies pain   See Function Navigator for Current Functional Status.   Therapy/Group: Individual Therapy  Waunita Schooner 07/26/2015, 8:43 AM

## 2015-07-26 NOTE — Progress Notes (Signed)
55 y.o. right handed male with unremarkable past medical history. Lives with spouse independent prior to admission. One level home with level entry. Patient is employed as a Environmental education officer. Wife works day shift. 3 children in the area also work. 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 Systolic blood pressures 123456.CT of the head showed acute left thalamic hemorrhage 2.0 x 1.8 cm. Placed on a Cardene drip for blood pressure control. CTA of head and neck negative for vascular malformation. No anterior circulation large vessel stenosis. 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 Echocardiogram with ejection fraction 123456 grade 1 diastolic dysfunction. Tolerating a regular diet. Presumed OSA with pulmonary service follow-up trial CPAP and follow-up sleep evaluation as outpatient  Subjective/Complaints: No issues overnite Had a 0000000 drop in systolic BP lying to standing  Review of systems negative for chest pain, shortness of breath, nausea, vomiting or diarrhea, good BM today  Objective: Vital Signs: Blood pressure 152/78, pulse 61, temperature 98 F (36.7 C), temperature source Oral, resp. rate 18, height 5\' 11"  (1.803 m), weight 121.201 kg (267 lb 3.2 oz), SpO2 99 %. No results found. No results found for this or any previous visit (from the past 72 hour(s)).   HEENT: normal Cardio: RRR and no murmur Resp: CTA B/L and unlabored GI: BS positive and nontender nondistended Extremity:  Pulses positive and No Edema Skin:   Intact Neuro: Alert/Oriented, Normal Sensory, Abnormal Motor 4+/5 in the right deltoid, biceps, triceps, grip, hip flexor, knee  extensor, ankle dorsi flexor and Abnormal FMC Ataxic/ dec FMC Musc/Skel:  Normal Gen. no acute distress   Assessment/Plan: 1. Functional deficits secondary to left thalamic ICH with right hemiparesis which require 3+ hours per day of interdisciplinary therapy in a comprehensive inpatient rehab setting. Physiatrist is providing close team supervision and 24 hour management of active medical problems listed below. Physiatrist and rehab team continue to assess barriers to discharge/monitor patient progress toward functional and medical goals. FIM: Function - Bathing Position: Shower Body parts bathed by patient: Right arm, Left arm, Chest, Abdomen, Front perineal area, Buttocks, Right upper leg, Left upper leg, Right lower leg, Left lower leg, Back Assist Level: Supervision or verbal cues  Function- Upper Body Dressing/Undressing What is the patient wearing?: Pull over shirt/dress Pull over shirt/dress - Perfomed by patient: Thread/unthread right sleeve, Thread/unthread left sleeve, Put head through opening, Pull shirt over trunk Assist Level: Supervision or verbal cues Function - Lower Body Dressing/Undressing What is the patient wearing?: Socks, Pants, Underwear, Non-skid slipper socks Underwear - Performed by patient: Thread/unthread right underwear leg, Thread/unthread left underwear leg, Pull underwear up/down Pants- Performed by patient: Thread/unthread right pants leg, Thread/unthread left pants leg, Pull pants up/down, Fasten/unfasten pants Non-skid slipper socks- Performed by patient: Don/doff right sock, Don/doff left sock Socks - Performed by patient: Don/doff right sock, Don/doff left sock Shoes - Performed by patient: Don/doff right shoe, Don/doff left shoe, Fasten right, Fasten left Assist for lower body dressing: Supervision or verbal cues  Function - Toileting Toileting steps completed by patient: Adjust clothing prior to toileting, Performs perineal hygiene, Adjust clothing  after toileting Toileting Assistive Devices: Grab bar or rail     Function - Chair/bed transfer Chair/bed  transfer method: Ambulatory Chair/bed transfer assist level: Supervision or verbal cues Chair/bed transfer details: Verbal cues for precautions/safety  Function - Locomotion: Wheelchair Will patient use wheelchair at discharge?: No Function - Locomotion: Ambulation Assistive device: No device Max distance: 300 Assist level: Supervision or verbal cues Assist level: Supervision or verbal cues Assist level: Supervision or verbal cues Assist level: Supervision or verbal cues Assist level: Supervision or verbal cues  Function - Comprehension Comprehension: Auditory Comprehension assist level: Follows complex conversation/direction with no assist  Function - Expression Expression: Verbal Expression assist level: Expresses complex ideas: With no assist  Function - Social Interaction Social Interaction assist level: Interacts appropriately with others - No medications needed.  Function - Problem Solving Problem solving assist level: Solves complex problems: With extra time  Function - Memory Memory assist level: Recognizes or recalls 90% of the time/requires cueing < 10% of the time Patient normally able to recall (first 3 days only): Current season, Location of own room, Staff names and faces, That he or she is in a hospital  Medical Problem List and Plan: 1.  Altered mental status and right-sided weakness secondary to left thalamic intracerebral hemorrhage secondary to hypertensive crisis- Plan D/C in am after therapies 2.  DVT Prophylaxis/Anticoagulation: SCDs. Monitor for any signs of DVT 3. Pain Management: Tylenol as needed 4. Hypertension. Norvasc 10 mg daily, labetalol 100 mg 3 times a day, lisinopril 20 mg twice a day. Monitor with increased mobility, +orthostatic changes mildly symptomatic, no change in meds, discussed slowing down with change in position  Filed  Vitals:   07/25/15 1935 07/26/15 0604  BP: 144/77 152/78  Pulse: 62 61  Temp:  98 F (36.7 C)  Resp:  18   5. Neuropsych: This patient is capable of making decisions on his own behalf. 6. Skin/Wound Care: Routine skin checks 7. Fluids/Electrolytes/Nutrition: Routine eye and nose with follow-up chemistries             Hypokalemia: Continue to monitor 8. Hyperlipidemia. Lipitor 9. Mood/anxiety. Xanax 0.5 mg 3 times daily as needed. 10. Probable sleep apnea. Plan outpatient evaluation 11. Morbid obesity. Body mass index 38.5 kg. Dietary follow-up, low albumin will start pro-stat 12. Presumed OSA: Continue CPAP 13. AKI: Avoid nephrotoxic meds BMP Latest Ref Rng 07/23/2015 07/22/2015 07/21/2015  Glucose 65 - 99 mg/dL 75 96 110(H)  BUN 6 - 20 mg/dL 18 17 21(H)  Creatinine 0.61 - 1.24 mg/dL 1.34(H) 1.43(H) 1.45(H)  Sodium 135 - 145 mmol/L 140 140 138  Potassium 3.5 - 5.1 mmol/L 4.1 3.4(L) 3.1(L)  Chloride 101 - 111 mmol/L 108 104 104  CO2 22 - 32 mmol/L 20(L) 25 23  Calcium 8.9 - 10.3 mg/dL 8.5(L) 8.6(L) 8.4(L)      LOS (Days) 4 A FACE TO FACE EVALUATION WAS PERFORMED  KIRSTEINS,ANDREW E 07/26/2015, 8:01 AM

## 2015-07-26 NOTE — Discharge Instructions (Signed)
Inpatient Rehab Discharge Instructions  Glenn Liu Discharge date and time: No discharge date for patient encounter.   Activities/Precautions/ Functional Status: Activity: activity as tolerated Diet: regular diet Wound Care: none needed Functional status:  ___ No restrictions     ___ Walk up steps independently ___ 24/7 supervision/assistance   ___ Walk up steps with assistance ___ Intermittent supervision/assistance  ___ Bathe/dress independently ___ Walk with walker     _x__ Bathe/dress with assistance ___ Walk Independently    ___ Shower independently ___ Walk with assistance    ___ Shower with assistance ___ No alcohol     ___ Return to work/school ________  Special Instructions:  No driving  Follow up Primary care Physician for outpatient sleep study work up sleep apnea    COMMUNITY REFERRALS UPON DISCHARGE:      Outpatient: [facility] & [place]   Date of Last Service:07/27/2015  Appointment Date/Time: TO CONTACT PT/WIFE TO SCHEDULE APPOINTMENT FOR OPPT & SP  Medical Equipment/Items Ordered: NONE NEEDED     GENERAL COMMUNITY RESOURCES FOR PATIENT/FAMILY: Support Groups: My questions have been answered and I understand these instructions. I will adhere to these goals and the provided educational materials after my discharge from the hospital.  Patient/Caregiver Signature _______________________________ Date __________  Clinician Signature _______________________________________ Date __________  Please bring this form and your medication list with you to all your follow-up doctor's appointments.

## 2015-07-27 ENCOUNTER — Inpatient Hospital Stay (HOSPITAL_COMMUNITY): Payer: 59 | Admitting: Speech Pathology

## 2015-07-27 ENCOUNTER — Inpatient Hospital Stay (HOSPITAL_COMMUNITY): Payer: 59 | Admitting: Occupational Therapy

## 2015-07-27 ENCOUNTER — Inpatient Hospital Stay (HOSPITAL_COMMUNITY): Payer: 59

## 2015-07-27 MED ORDER — LABETALOL HCL 200 MG PO TABS: 200.0000 mg | ORAL_TABLET | Freq: Three times a day (TID) | ORAL | Status: DC

## 2015-07-27 MED ORDER — ALPRAZOLAM 0.5 MG PO TABS: 0.5000 mg | ORAL_TABLET | Freq: Three times a day (TID) | ORAL | Status: DC | PRN

## 2015-07-27 MED ORDER — ATORVASTATIN CALCIUM 20 MG PO TABS: 20.0000 mg | ORAL_TABLET | Freq: Every day | ORAL | Status: DC

## 2015-07-27 MED ORDER — LISINOPRIL 20 MG PO TABS: 20.0000 mg | ORAL_TABLET | Freq: Two times a day (BID) | ORAL | Status: DC

## 2015-07-27 MED ORDER — AMLODIPINE BESYLATE 10 MG PO TABS: 10.0000 mg | ORAL_TABLET | Freq: Every day | ORAL | Status: DC

## 2015-07-27 NOTE — Progress Notes (Signed)
Occupational Therapy Session Note  Patient Details  Name: Glenn Liu MRN: [id number] Date of Birth: 24-Feb-1961  Today's Date: 07/27/2015 OT Individual Time: 0830-0900 OT Individual Time Calculation (min): 30 min    Short Term Goals: Week 1:  OT Short Term Goal 1 (Week 1): STGs equal to LTGs set a modified independent level.       Skilled Therapeutic Interventions/Progress Updates:    Pt seen for skilled OT to work with pt on ADL skills to ensure he is at a mod I level prior to discharge. Pt did not need extra time, equipment or any cues to complete any of his self care and is actually at an I level. 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 Discussed strategies to slowly, gradually return to work. Pt is ready for discharge. Education with pt only as his wife did not arrive in time for his therapy session.  PT to review with wife during his PT session education.    Therapy Documentation Precautions:  Precautions Precautions: Fall Restrictions Weight Bearing Restrictions: No    Vital Signs: Therapy Vitals Temp: 97.8 F (36.6 C) Temp Source: Oral Pulse Rate: (!) 57 Resp: 19 BP: 129/60 mmHg Patient Position (if appropriate): Lying Oxygen Therapy SpO2: 98 % O2 Device: Not Delivered Pain: Pain Assessment Pain Assessment: No/denies pain ADL:   See Function Navigator for Current Functional Status.   Therapy/Group: Individual Therapy  [place] 07/27/2015, 8:49 AM

## 2015-07-27 NOTE — Progress Notes (Signed)
Occupational Therapy Discharge Summary  Patient Details  Name: Glenn Liu MRN: [id number] Date of Birth: 1960/06/15   Patient has met 62 of 11 long term goals due to improved activity tolerance, improved balance, improved attention, improved awareness and improved coordination.  Patient to discharge at overall Independent level.  Patient's care partner is independent to provide the necessary cognitive (minimally impaired STM) assistance at discharge.    Reasons goals not met: n/a  Recommendation:  No further OT services recommended at this time.  Equipment: No equipment provided  Reasons for discharge: treatment goals met  Patient/family agrees with progress made and goals achieved: Yes  OT Discharge ADL ADL ADL Comments: Independent with all self care Vision/Perception  Vision- History Baseline Vision/History: Wears glasses Vision- Assessment Eye Alignment: Within Functional Limits Ocular Range of Motion: Within Functional Limits Alignment/Gaze Preference: Within Defined Limits Tracking/Visual Pursuits: Able to track stimulus in all quads without difficulty Saccades: Within functional limits Convergence: Within functional limits Visual Fields: No apparent deficits Perception Comments: WFL  Cognition Overall Cognitive Status: Impaired/Different from baseline Arousal/Alertness: Awake/alert Orientation Level: Oriented X4 Attention: Sustained;Selective Focused Attention: Appears intact Sustained Attention: Appears intact Selective Attention: Appears intact Memory: Impaired Memory Impairment: Decreased recall of new information Decreased Short Term Memory: Functional complex Awareness: Appears intact Problem Solving: Appears intact Reasoning: Appears intact Safety/Judgment: Appears intact Sensation Sensation Light Touch: Appears Intact Stereognosis: Appears Intact Hot/Cold: Appears Intact Proprioception Impaired Details: Impaired RLE Additional Comments: mild  impairment in proprioception R hallux Coordination Gross Motor Movements are Fluid and Coordinated: Yes Fine Motor Movements are Fluid and Coordinated: Yes Heel Shin Test: mild impairment RLE (slow speed) however WFL Motor  Motor Motor: Hemiplegia;Ataxia Motor - Skilled Clinical Observations: Mild RLE weakness and ataxia Mobility  Bed Mobility Bed Mobility:  (independent for all) Transfers Sit to Stand: 7: Independent Stand to Sit: 7: Independent  Trunk/Postural Assessment  Cervical Assessment Cervical Assessment: Within Functional Limits Thoracic Assessment Thoracic Assessment: Within Functional Limits Lumbar Assessment Lumbar Assessment: Within Functional Limits Postural Control Postural Control: Within Functional Limits  Balance Balance Balance Assessed: Yes Standardized Balance Assessment Standardized Balance Assessment: Berg Balance Test Berg Balance Test Sit to Stand: Able to stand without using hands and stabilize independently Standing Unsupported: Able to stand safely 2 minutes Sitting with Back Unsupported but Feet Supported on Floor or Stool: Able to sit safely and securely 2 minutes Stand to Sit: Sits safely with minimal use of hands Transfers: Able to transfer safely, minor use of hands Standing Unsupported with Eyes Closed: Able to stand 10 seconds safely Standing Ubsupported with Feet Together: Able to place feet together independently and stand 1 minute safely From Standing, Reach Forward with Outstretched Arm: Can reach confidently >25 cm (10") From Standing Position, Pick up Object from Floor: Able to pick up shoe safely and easily From Standing Position, Turn to Look Behind Over each Shoulder: Looks behind from both sides and weight shifts well Turn 360 Degrees: Able to turn 360 degrees safely in 4 seconds or less Standing Unsupported, Alternately Place Feet on Step/Stool: Able to complete >2 steps/needs minimal assist Standing Unsupported, One Foot in  Front: Able to take small step independently and hold 30 seconds Standing on One Leg: Able to lift leg independently and hold equal to or more than 3 seconds Total Score: 49 Dynamic Gait Index Level Surface: Normal Change in Gait Speed: Normal Gait with Horizontal Head Turns: Normal Gait with Vertical Head Turns: Normal Gait and Pivot Turn: Mild Impairment Step Over Obstacle:  Mild Impairment Step Around Obstacles: Mild Impairment Steps: Mild Impairment Total Score: 20 Timed Up and Go Test TUG: Normal TUG Normal TUG (seconds): 9.3 Static Sitting Balance Static Sitting - Level of Assistance: 7: Independent Dynamic Sitting Balance Dynamic Sitting - Level of Assistance: 7: Independent Static Standing Balance Static Standing - Level of Assistance: 7: Independent Dynamic Standing Balance Dynamic Standing - Level of Assistance: 7: Independent Extremity/Trunk Assessment RUE Assessment RUE Assessment: Within Functional Limits LUE Assessment LUE Assessment: Within Functional Limits   See Function Navigator for Current Functional Status.  [place] 07/27/2015, 12:31 PM

## 2015-07-27 NOTE — Progress Notes (Signed)
Subjective/Complaints: Dizziness better Has no PCP but is planning to f/u with a NP in his congregation  Review of systems negative for chest pain, shortness of breath, nausea, vomiting or diarrhea, good BM today  Objective: Vital Signs: Blood pressure 129/60, pulse 57, temperature 97.8 F (36.6 C), temperature source Oral, resp. rate 19, height 5\' 11"  (1.803 m), weight 121.201 kg (267 lb 3.2 oz), SpO2 98 %. No results found. No results found for this or any previous visit (from the past 72 hour(s)).   HEENT: normal Cardio: RRR and no murmur Resp: CTA B/L and unlabored GI: BS positive and nontender nondistended Extremity:  Pulses positive and No Edema Skin:   Intact Neuro: Alert/Oriented, Normal Sensory, Abnormal Motor 4+/5 in the right deltoid, biceps, triceps, grip, hip flexor, knee extensor, ankle dorsi flexor and Abnormal FMC Ataxic/ dec FMC Musc/Skel:  Normal Gen. no acute distress   Assessment/Plan: 1. Functional deficits secondary to left thalamic ICH with right hemiparesis Stable for D/C today F/u PCP in 3-4 weeks F/u PM&R 2 weeks See D/C summary See D/C instructions FIM: Function - Bathing Position: Shower Body parts bathed by patient: Right arm, Left arm, Chest, Abdomen, Front perineal area, Buttocks, Right upper leg, Left upper leg, Right lower leg, Left lower leg, Back Assist Level: Supervision or verbal cues  Function- Upper Body Dressing/Undressing What is the patient wearing?: Pull over shirt/dress Pull over shirt/dress - Perfomed by patient: Thread/unthread right sleeve, Thread/unthread left sleeve, Put head through opening, Pull shirt over trunk Assist Level: Supervision or verbal cues Function - Lower Body Dressing/Undressing What is the patient wearing?: Socks, Pants, Underwear, Non-skid slipper socks Underwear - Performed by patient: Thread/unthread right underwear leg, Thread/unthread left underwear leg, Pull underwear up/down Pants- Performed by  patient: Thread/unthread right pants leg, Thread/unthread left pants leg, Pull pants up/down, Fasten/unfasten pants Non-skid slipper socks- Performed by patient: Don/doff right sock, Don/doff left sock Socks - Performed by patient: Don/doff right sock, Don/doff left sock Shoes - Performed by patient: Don/doff right shoe, Don/doff left shoe, Fasten right, Fasten left Assist for lower body dressing: Supervision or verbal cues  Function - Toileting Toileting steps completed by patient: Adjust clothing prior to toileting, Performs perineal hygiene, Adjust clothing after toileting Toileting Assistive Devices: Grab bar or rail  Function - Toilet Transfers Assist level to toilet: Supervision or verbal cues Assist level from toilet: Supervision or verbal cues  Function - Chair/bed transfer Chair/bed transfer method: Ambulatory Chair/bed transfer assist level: Supervision or verbal cues Chair/bed transfer details: Verbal cues for precautions/safety  Function - Locomotion: Wheelchair Will patient use wheelchair at discharge?: No Function - Locomotion: Ambulation Assistive device: No device Max distance: >300 ft Assist level: No help, No cues, assistive device, takes more than a reasonable amount of time Assist level: No help, No cues, assistive device, takes more than a reasonable amount of time Assist level: No help, No cues, assistive device, takes more than a reasonable amount of time Assist level: No help, No cues, assistive device, takes more than a reasonable amount of time Assist level: No help, No cues, assistive device, takes more than a reasonable amount of time  Function - Comprehension Comprehension: Auditory Comprehension assist level: Follows complex conversation/direction with no assist  Function - Expression Expression: Verbal Expression assist level: Expresses complex ideas: With no assist  Function - Social Interaction Social Interaction assist level: Interacts  appropriately with others with medication or extra time (anti-anxiety, antidepressant).  Function - Problem Solving Problem solving assist level:  Solves complex problems: With extra time  Function - Memory Memory assist level: Recognizes or recalls 90% of the time/requires cueing < 10% of the time Patient normally able to recall (first 3 days only): Current season, Location of own room, Staff names and faces, That he or she is in a hospital  Medical Problem List and Plan: 1.  Altered mental status and right-sided weakness secondary to left thalamic intracerebral hemorrhage secondary to hypertensive crisis- Plan D/C this  am after therapies 2.  DVT Prophylaxis/Anticoagulation: SCDs. Monitor for any signs of DVT 3. Pain Management: Tylenol as needed 4. Hypertension. Norvasc 10 mg daily, labetalol 100 mg 3 times a day, lisinopril 20 mg twice a day. Monitor with increased mobility, +orthostatic changes mildly symptomatic, no change in meds, discussed slowing down with change in position  Filed Vitals:   07/26/15 2046 07/27/15 0544  BP: 131/72 129/60  Pulse: 59 57  Temp:  97.8 F (36.6 C)  Resp:  19   5. Neuropsych: This patient is capable of making decisions on his own behalf. 6. Skin/Wound Care: Routine skin checks 7. Fluids/Electrolytes/Nutrition: Routine eye and nose with follow-up chemistries             Hypokalemia: Continue to monitor 8. Hyperlipidemia. Lipitor 9. Mood/anxiety. Xanax 0.5 mg 3 times daily as needed. 10. Probable sleep apnea. Plan outpatient evaluation 11. Morbid obesity. Body mass index 38.5 kg. Dietary follow-up, low albumin will start pro-stat 12. Presumed OSA: Continue CPAP 13. AKI: Avoid nephrotoxic meds BMP Latest Ref Rng 07/23/2015 07/22/2015 07/21/2015  Glucose 65 - 99 mg/dL 75 96 110(H)  BUN 6 - 20 mg/dL 18 17 21(H)  Creatinine 0.61 - 1.24 mg/dL 1.34(H) 1.43(H) 1.45(H)  Sodium 135 - 145 mmol/L 140 140 138  Potassium 3.5 - 5.1 mmol/L 4.1 3.4(L) 3.1(L)   Chloride 101 - 111 mmol/L 108 104 104  CO2 22 - 32 mmol/L 20(L) 25 23  Calcium 8.9 - 10.3 mg/dL 8.5(L) 8.6(L) 8.4(L)      LOS (Days) 5 A FACE TO FACE EVALUATION WAS PERFORMED  Jazen Spraggins E 07/27/2015, 7:55 AM

## 2015-07-27 NOTE — Progress Notes (Signed)
Physical Therapy Discharge Summary  Patient Details  Name: Glenn Liu MRN: [id number] Date of Birth: 07/24/1960  Today's Date: 07/27/2015 PT Individual Time: 1115-1200 PT Individual Time Calculation (min): 45 min    Patient has met 9 of 10 long term goals due to improved activity tolerance, improved balance, ability to compensate for deficits and functional use of  right lower extremity.  Patient to discharge at an ambulatory level Independent.   Patient's care partner is independent to provide the necessary cognitive assistance for car transfer at discharge.  Floor transfer was not covered with wife on last day; PT left a message on wife's phone about it, and recommended she call the department if she had questions.  Reasons goals not met:n/a  Recommendation:  Patient will benefit from ongoing skilled PT services in outpatient setting to continue to advance safe functional mobility, address ongoing impairments in high level balance, muscle atrophy bil VMO (PTA), and minimize fall risk.  Equipment: No equipment provided  Reasons for discharge: treatment goals met and discharge from hospital  Patient/family agrees with progress made and goals achieved: Yes  PT Discharge Precautions/Restrictions- fall   Pain -none   Vision/Perception  Vision - Assessment Eye Alignment: Within Functional Limits Ocular Range of Motion: Within Functional Limits Alignment/Gaze Preference: Within Defined Limits Tracking/Visual Pursuits: Able to track stimulus in all quads without difficulty Saccades: Within functional limits Convergence: Within functional limits Perception Comments: WFL  Cognition Overall Cognitive Status: Impaired/Different from baseline Arousal/Alertness: Awake/alert Orientation Level: Oriented X4 Attention: Sustained;Selective Focused Attention: Appears intact Sustained Attention: Appears intact Selective Attention: Appears intact Memory: Impaired Memory Impairment:  Decreased recall of new information Decreased Short Term Memory: Functional complex Awareness: Appears intact Problem Solving: Appears intact Reasoning: Appears intact Safety/Judgment: Appears intact Sensation Sensation Light Touch: Appears Intact Stereognosis: Appears Intact Hot/Cold: Appears Intact Proprioception Impaired Details: Impaired RLE Additional Comments: mild impairment in proprioception R hallux Coordination Gross Motor Movements are Fluid and Coordinated: Yes Fine Motor Movements are Fluid and Coordinated: Yes Heel Shin Test: mild impairment RLE (slow speed) however WFL Motor  Motor Motor: Hemiplegia;Ataxia Motor - Skilled Clinical Observations: Mild RLE weakness and ataxia  Mobility Bed Mobility Bed Mobility:  (independent for all) Transfers Transfers: Yes Sit to Stand: 7: Independent Stand to Sit: 7: Independent Stand Pivot Transfers: 7: Independent Locomotion  Ambulation Ambulation: Yes Ambulation/Gait Assistance: 7: Independent Ambulation Distance (Feet): 150 Feet Assistive device: None Gait Gait: Yes Gait Pattern: Impaired Gait Pattern: Decreased trunk rotation Gait velocity: 2.57 ft/sec High Level Ambulation High Level Ambulation: Side stepping;Backwards walking;Direction changes;Sudden stops;Head turns Side Stepping: I,  no LOB Direction Changes: I, no LOB Sudden Stops: I,  no LOB Head Turns: I,  no LOB Stairs / Additional Locomotion Stairs: Yes Stairs Assistance: 7: Independent Stair Management Technique: Two rails;Alternating pattern;Forwards Number of Stairs: 12 Height of Stairs: 6 Ramp: 5: Supervision Curb: 5: Supervision Wheelchair Mobility Wheelchair Mobility: No  Trunk/Postural Assessment  Cervical Assessment Cervical Assessment: Within Functional Limits Thoracic Assessment Thoracic Assessment: Within Functional Limits Lumbar Assessment Lumbar Assessment: Within Functional Limits Postural Control Postural Control: Within  Functional Limits  Balance Balance Balance Assessed: Yes (pt exhibits bil ankle strategy, absent hip and stepping strategies with external perturbations ) Standardized Balance Assessment Standardized Balance Assessment: Berg Balance Test Berg Balance Test Sit to Stand: Able to stand without using hands and stabilize independently Standing Unsupported: Able to stand safely 2 minutes Sitting with Back Unsupported but Feet Supported on Floor or Stool: Able to sit safely and securely   2 minutes Stand to Sit: Sits safely with minimal use of hands Transfers: Able to transfer safely, minor use of hands Standing Unsupported with Eyes Closed: Able to stand 10 seconds safely Standing Ubsupported with Feet Together: Able to place feet together independently and stand 1 minute safely From Standing, Reach Forward with Outstretched Arm: Can reach confidently >25 cm (10") From Standing Position, Pick up Object from Floor: Able to pick up shoe safely and easily From Standing Position, Turn to Look Behind Over each Shoulder: Looks behind from both sides and weight shifts well Turn 360 Degrees: Able to turn 360 degrees safely in 4 seconds or less Standing Unsupported, Alternately Place Feet on Step/Stool: Able to complete >2 steps/needs minimal assist Standing Unsupported, One Foot in Front: Able to take small step independently and hold 30 seconds Standing on One Leg: Able to lift leg independently and hold equal to or more than 3 seconds Total Score: 49 Dynamic Gait Index Level Surface: Normal Change in Gait Speed: Normal Gait with Horizontal Head Turns: Normal Gait with Vertical Head Turns: Normal Gait and Pivot Turn: Mild Impairment Step Over Obstacle: Mild Impairment Step Around Obstacles: Mild Impairment Steps: Mild Impairment Total Score: 20 Timed Up and Go Test TUG: Normal TUG Normal TUG (seconds): 9.3 Static Sitting Balance Static Sitting - Balance Support: No upper extremity supported;Feet  supported Static Sitting - Level of Assistance: 7: Independent Dynamic Sitting Balance Dynamic Sitting - Balance Support: No upper extremity supported Dynamic Sitting - Level of Assistance: 7: Independent Static Standing Balance Static Standing - Level of Assistance: 7: Independent Dynamic Standing Balance Dynamic Standing - Level of Assistance: 7: Independent Extremity Assessment  RUE Assessment RUE Assessment: Within Functional Limits LUE Assessment LUE Assessment: Within Functional Limits RLE Assessment RLE Assessment: Exceptions to WFL (mild strength deficits 4+/5 throughout)     See Function Navigator for Current Functional Status.  COOK,CAROLINE 07/27/2015, 12:32 PM   

## 2015-07-27 NOTE — Progress Notes (Signed)
Speech Language Pathology Session Note & Discharge Summary  Patient Details  Name: Glenn Liu MRN: [id number] Date of Birth: 1960-09-12  Today's Date: 07/27/2015 SLP Individual Time: 1000-1030 SLP Individual Time Calculation (min): 30 min   Skilled Therapeutic Interventions:  Skilled treatment session focused on cognitive goals and completion of patient and family education. SLP facilitated session by re-administering the MoCA (version 7.3). Patient scored 25/30 points with a score of 26 or above considered normal. Patient demonstrated deficits in recall after a 5 minute delay due to inability to use compensatory strategies throughout the evaluation. However, patient was Mod I for recall of functional information throughout a functional conversation in regards to memory compensatory strategies and implementation at home. Patient's wife present and verbalized understanding of all information and reported she felt the patient was close to his cognitive baseline. Handouts were also previously given to reinforce information. Patient left upright in recliner with all needs within reach and wife present. Patient will discharge home today.    Patient has met 3 of 3 long term goals.  Patient to discharge at overall Modified Independent level.   Reasons goals not met: N/A    Clinical Impression/Discharge Summary: Patient has made functional gains and has met 3 of 3 LTG's this admission due to improved cognitive function. Currently, patient is Mod I to complete functional and familiar tasks safely in regards to recall and complex problem solving. However, patient continues to report he would like f/u therapy to maximize his cognitive function. Patient and family education is complete and patient will discharge home with supervision from family. Patient would benefit from f/u SLP services to maximize cognitive function and overall functional independence to reduce caregiver burden.   Care Partner:   Caregiver Able to Provide Assistance: Yes  Type of Caregiver Assistance: Cognitive  Recommendation:  Outpatient SLP  Rationale for SLP Follow Up: Maximize cognitive function and independence   Equipment: N/A    Reasons for discharge: Treatment goals met;Discharged from hospital   Patient/Family Agrees with Progress Made and Goals Achieved: Yes   Function:  Cognition Comprehension Comprehension assist level: Follows complex conversation/direction with no assist  Expression   Expression assist level: Expresses complex ideas: With no assist  Social Interaction Social Interaction assist level: Interacts appropriately with others with medication or extra time (anti-anxiety, antidepressant).  Problem Solving Problem solving assist level: Solves complex problems: With extra time  Memory Memory assist level: More than reasonable amount of time   [name] 07/27/2015, 10:54 AM

## 2015-07-27 NOTE — Progress Notes (Signed)
Social Work  Discharge Note  The overall goal for the admission was met for:   Discharge location: [place] [facility]  Length of Stay: Yes-5 DAYS  Discharge activity level: Yes-MOD/I LEVEL  Home/community participation: Yes  Services provided included: MD, RD, PT, OT, SLP, RN, CM, TR, Pharmacy and [place]: Private Insurance: [facility]  Follow-up services arranged: Outpatient: [facility] YOU TO SCHEDULE APPPOINTMENTS  Comments (or additional information):WIFE HERE FOR FAMILY EDUCATION TODAY AND READY FOR DC AFTER THERAPIES. NEW PCP APPOINTMENT FOR FOLLOW UP  Patient/Family verbalized understanding of follow-up arrangements: Yes  Individual responsible for coordination of the follow-up plan: SELF & KARLA-WIFE  Confirmed correct DME delivered: Elease Hashimoto 07/27/2015    Elease Hashimoto

## 2015-07-27 NOTE — Progress Notes (Signed)
Pt discharged at 30 with wife to home. Discharge instructions given to pt and family from Silvestre Mesi PA with verbal understanding.

## 2015-07-31 ENCOUNTER — Telehealth: Payer: Self-pay | Admitting: *Deleted

## 2015-07-31 NOTE — Telephone Encounter (Addendum)
Transitional Care  No answer and message left with appt date and time and to call our office   1. Are you/is patient experiencing any problems since coming home? Are there any questions regarding any aspect of care? 2. Are there any questions regarding medications administration/dosing? Are meds being taken as prescribed? Patient should review meds with caller to confirm 3. Have there been any falls? 4. Has Home Health been to the house and/or have they contacted you? If not, have you tried to contact them? Can we help you contact them? 5. Are bowels and bladder emptying properly? Are there any unexpected incontinence issues? If applicable, is patient following bowel/bladder programs? 6. Any fevers, problems with breathing, unexpected pain? 7. Are there any skin problems or new areas of breakdown? 8. Has the patient/family member arranged specialty MD follow up (ie cardiology/neurology/renal/surgical/etc)?  Can we help arrange? 9. Does the patient need any other services or support that we can help arrange? 10. Are caregivers following through as expected in assisting the patient? Has the patient quit smoking, drinking alcohol, or using drugs as recommended?  Follow up appt 08/06/15 arrive @ 10:30 for 10:45 with Dr Letta Pate Packet mailed to the home

## 2015-08-04 ENCOUNTER — Telehealth: Payer: Self-pay | Admitting: Neurology

## 2015-08-04 NOTE — Telephone Encounter (Signed)
Patient was seen by Dr. 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

## 2015-08-04 NOTE — Telephone Encounter (Signed)
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 Pt stated he was discharge from hospital 07/22/2015 and was in rehab. Pt stated he now goes to outpatient rehab in [place] and gets dizzy sometimes. Rn stated sometimes its common to be off balance and dizzy after having a stroke. Rn ask patient does he take his blood pressure daily. PT stated his blood pressure has been good. 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 Pt stated he thinks he want preach but say a 3 min speech and than sat down. Rn stated he should climb letters. Or get on roofs until cleared by therapy and Dr. Erlinda Hong. Rn recommended patient just say a short speech during church until he feels better. Pt agreed and verbalized understanding.

## 2015-08-06 ENCOUNTER — Ambulatory Visit (HOSPITAL_BASED_OUTPATIENT_CLINIC_OR_DEPARTMENT_OTHER): Payer: 59 | Admitting: Physical Medicine & Rehabilitation

## 2015-08-06 ENCOUNTER — Encounter: Payer: Self-pay | Admitting: Physical Medicine & Rehabilitation

## 2015-08-06 ENCOUNTER — Encounter: Payer: 59 | Attending: Physical Medicine & Rehabilitation

## 2015-08-06 VITALS — BP 154/81 | HR 66 | Resp 14

## 2015-08-06 DIAGNOSIS — R269 Unspecified abnormalities of gait and mobility: Secondary | ICD-10-CM

## 2015-08-06 DIAGNOSIS — I69393 Ataxia following cerebral infarction: Secondary | ICD-10-CM | POA: Insufficient documentation

## 2015-08-06 DIAGNOSIS — E785 Hyperlipidemia, unspecified: Secondary | ICD-10-CM | POA: Diagnosis not present

## 2015-08-06 DIAGNOSIS — I10 Essential (primary) hypertension: Secondary | ICD-10-CM | POA: Diagnosis not present

## 2015-08-06 DIAGNOSIS — I69398 Other sequelae of cerebral infarction: Secondary | ICD-10-CM | POA: Diagnosis not present

## 2015-08-06 DIAGNOSIS — G4733 Obstructive sleep apnea (adult) (pediatric): Secondary | ICD-10-CM | POA: Insufficient documentation

## 2015-08-06 DIAGNOSIS — R4182 Altered mental status, unspecified: Secondary | ICD-10-CM | POA: Insufficient documentation

## 2015-08-06 DIAGNOSIS — I619 Nontraumatic intracerebral hemorrhage, unspecified: Secondary | ICD-10-CM | POA: Diagnosis not present

## 2015-08-06 NOTE — Patient Instructions (Signed)
Call [facility] to schedule your outpatient therapy  Follow up with neurology on 09/10/2015 may discuss return to driving at that time . Do not advise any strenuous yard work at the current time. May do housework  Avoid concentrated sweets, focus on fruits and vegetables. Drink plenty of fluid but not sugary drinks

## 2015-08-06 NOTE — Progress Notes (Signed)
Subjective:    Patient ID: Glenn Liu, male    DOB: 15-Nov-1960, 55 y.o.   MRN: [id number] 55 year old right-handed male with morbid obesity.  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  Presented on July 19, 2015, with transient altered mental status, right-sided weakness while at church.  Systolic blood pressure 123456.  CT of the head showed acute left thalamic hemorrhage 2.0 x 1.8 cm.  Placed on Cardene drip for blood pressure control.  CTA of the head and neck negative for vascular malformation.  No anterior circulation or large vessel stenosis. 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  No pharyngeal primary tumor identified DATE OF ADMISSION:  07/22/2015 DATE OF DISCHARGE:  07/27/2015  HPI Patient has returned home he is independent with all his self-care and mobility. He does not use an assistive device. He has not tried driving yet he still feels a little lightheaded. His main complaint is lightheaded feeling. He denies any vertigo-type situation. We discussed that on admission to the hospital for his stroke his systolic blood pressure was 265.   Has had no falls.  Has not had any therapy yet.He was scheduled to go to outpatient therapy at [facility] however he took a Xanax that day and slept through the call back and has not rescheduled. He is interested in returning to driving but he does not feel ready. 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 General activity NA Relation with others NA Enjoyment of life NA What TIME of day is your pain at its worst? NA Sleep (in general) Good  Pain is worse with: NA Pain improves with: NA Relief from Meds: NA  Mobility walk without  assistance how many minutes can you walk? 20-30 ability to climb steps?  yes do you drive?  no Do you have any goals in this area?  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   Family History  Problem Relation Age of Onset  . Heart attack Mother    Social History   Social History  . Marital Status: Married    Spouse Name: N/A  . Number of Children: N/A  . Years of Education: N/A   Social History Main Topics  . Smoking status: Never Smoker   . Smokeless tobacco: None  . Alcohol Use: None  . Drug Use: None  . Sexual Activity: Not Asked   Other Topics Concern  . None   Social History Narrative   History reviewed. No pertinent past surgical history. History reviewed. No pertinent past medical history. BP 154/81 mmHg  Pulse 66  Resp 14  SpO2 98%  Opioid Risk Score:   Fall Risk Score:  `1  Depression screen PHQ 2/9  Depression screen PHQ 2/9 08/06/2015  Decreased Interest 0  Down, Depressed, Hopeless 0  PHQ - 2 Score 0  Altered sleeping 0  Tired, decreased energy 1  Change in appetite 0  Feeling bad or failure about yourself  0  Trouble concentrating 1  Moving slowly or fidgety/restless 0  Suicidal thoughts 0  PHQ-9 Score 2  Difficult doing work/chores Somewhat difficult     Review of Systems  Neurological: Positive for dizziness and weakness.  All other systems reviewed and are negative.      Objective:   Physical Exam  Constitutional: He is oriented to person, place, and time. He appears well-developed and well-nourished.  HENT:  Head: Normocephalic and atraumatic.  Eyes: Conjunctivae and EOM are normal. Pupils are equal, round, and reactive to light.  Visual fields are intact to confrontation testing  Neck: Normal range of motion. Neck supple.  Cardiovascular: Normal rate, regular rhythm and normal heart sounds.     Pulmonary/Chest: Effort normal and breath sounds normal.  Abdominal: Soft. Bowel sounds are normal.  Neurological: He is alert and oriented to person, place, and time. No sensory deficit. Coordination normal.  Motor strength is 5/5 bilateral deltoid, biceps, triceps, grip, hip flexion, knee extension, ankle dorsi flexion plantar flexion Sensation intact to light touch.  Psychiatric: He has a normal mood and affect.  Nursing note and vitals reviewed.         Assessment & Plan:  Medical Problem List and Plan: 1.  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 He can be reevaluated by neurology next month  Given his limited functional deficits, we'll not schedule return visit with physical medicine and rehabilitation 2. Pain Management: Tylenol as needed 3. Hypertension. Norvasc 10 mg daily, labetalol 100 mg 3 times a day, lisinopril 20 mg twice a day. Monitor with increased mobility, Blood pressure is under good control in general however the patient has been running high blood pressures probably for a long time and is now adjusting to a normal blood pressure. We'll follow-up with PCP  4. Hyperlipidemia. Lipitor 5. Mood/anxiety. Xanax 0.5 too strong, Option of either taking 0.25 mg of Xanax or using nonmedication methods of managing anxiety such as walking 6. Probable sleep apnea. Plan outpatient evaluation 7. Morbid obesity. Body mass index 38.5 kg.  8. Presumed OSA: Sleep study recommended, Patient will ask his PCP in regards to this.

## 2015-08-27 ENCOUNTER — Other Ambulatory Visit: Payer: Self-pay | Admitting: Family Medicine

## 2015-08-27 MED ORDER — LISINOPRIL 20 MG PO TABS: 20.0000 mg | ORAL_TABLET | Freq: Two times a day (BID) | ORAL | Status: AC

## 2015-08-27 MED ORDER — LABETALOL HCL 200 MG PO TABS: 200.0000 mg | ORAL_TABLET | Freq: Three times a day (TID) | ORAL | Status: DC

## 2015-08-27 MED ORDER — AMLODIPINE BESYLATE 10 MG PO TABS: 10.0000 mg | ORAL_TABLET | Freq: Every day | ORAL | Status: AC

## 2015-08-27 MED ORDER — ATORVASTATIN CALCIUM 20 MG PO TABS: 20.0000 mg | ORAL_TABLET | Freq: Every day | ORAL | Status: DC

## 2015-09-10 ENCOUNTER — Encounter: Payer: Self-pay | Admitting: Neurology

## 2015-09-10 ENCOUNTER — Ambulatory Visit (INDEPENDENT_AMBULATORY_CARE_PROVIDER_SITE_OTHER): Payer: 59 | Admitting: Neurology

## 2015-09-10 VITALS — BP 152/58 | HR 59 | Ht 71.0 in | Wt 240.0 lb

## 2015-09-10 DIAGNOSIS — I10 Essential (primary) hypertension: Secondary | ICD-10-CM

## 2015-09-10 DIAGNOSIS — E785 Hyperlipidemia, unspecified: Secondary | ICD-10-CM

## 2015-09-10 DIAGNOSIS — I610 Nontraumatic intracerebral hemorrhage in hemisphere, subcortical: Secondary | ICD-10-CM

## 2015-09-10 MED ORDER — ASPIRIN EC 81 MG PO TBEC: 81.0000 mg | DELAYED_RELEASE_TABLET | Freq: Every day | ORAL | Status: DC

## 2015-09-10 NOTE — Progress Notes (Signed)
STROKE NEUROLOGY FOLLOW UP NOTE  NAME: Glenn Liu DOB: 06-20-1960  REASON FOR VISIT: stroke follow up HISTORY FROM: pt and chart  Today we had the pleasure of seeing Glenn Liu in follow-up at our Neurology Clinic. Pt was accompanied by no one.   History Summary Glenn Liu is a 55 y.o. male with history of hypertension and benign right neck cyst was admitted on 07/19/15 for altered mental status and right-sided weakness. CT showed a left thalamic ICH. 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 CTA aorta no dissection, but left renal cysts and LVH. TTE negative. LDL 136 and A1C 5.3. BP was high on arrival, put on cleviprex and labetalol drip, but later controlled with PO meds. Put on lipitor 20mg  and d/c to CIR.  Interval History During the interval time, the patient has been doing well. He is back to his baseline. He stayed in CIR for 4-5 days before discharging home. He has lost 27lbs intentionally. He finished outpt PT/OT. He complains of lightheadedness if standing or sitting up too fast. His BP at home around 130s/70s. Compliant with medication. He follows with PCP for right neck cystic lesion. He agrees with sleep study. Today BP 152/88.  REVIEW OF SYSTEMS: Full 14 system review of systems performed and notable only for those listed below and in HPI above, all others are negative:  Constitutional:   Activity change, appetite change Cardiovascular:  Ear/Nose/Throat:   Skin:  Eyes:   Respiratory:   Gastroitestinal:   Genitourinary:  Hematology/Lymphatic:   Endocrine: heat intolerance Musculoskeletal:   Allergy/Immunology:   Neurological:  dizziness Psychiatric:  Sleep:   The following represents the patient's updated allergies and side effects list: No Known Allergies  The neurologically relevant items on the patient's problem list were  reviewed on today's visit.  Neurologic Examination  A problem focused neurological exam (12 or more points of the single system neurologic examination, vital signs counts as 1 point, cranial nerves count for 8 points) was performed.  Blood pressure 152/58, pulse 59, height 5\' 11"  (1.803 m), weight 240 lb (108.863 kg).  General - Well nourished, well developed, in no apparent distress.  Ophthalmologic - Sharp disc margins OU. Fundi not visualized due to .  Cardiovascular - Regular rate and rhythm with no murmur.  Mental Status -  Level of arousal and orientation to time, place, and person were intact. Language including expression, naming, repetition, comprehension was assessed and found intact. Attention span and concentration were normal. Recent and remote memory were intact. Fund of Knowledge was assessed and was intact.  Cranial Nerves II - XII - II - Visual field intact OU. III, IV, VI - Extraocular movements intact. V - Facial sensation intact bilaterally. VII - Facial movement intact bilaterally. VIII - Hearing & vestibular intact bilaterally. X - Palate elevates symmetrically. XI - Chin turning & shoulder shrug intact bilaterally. XII - Tongue protrusion intact.  Motor Strength - The patient's strength was normal in all extremities and pronator drift was absent.  Bulk was normal and fasciculations were absent.   Motor Tone - Muscle tone was assessed at the neck and appendages and was normal.  Reflexes - The patient's reflexes were 1+ in all extremities and he had no pathological reflexes.  Sensory - Light touch, temperature/pinprick, vibration and proprioception, and Romberg testing were assessed and were normal.  Coordination - The patient had normal movements in the hands and feet with no ataxia or dysmetria.  Tremor was absent.  Gait and Station - The patient's transfers, posture, gait, station, and turns were observed as normal.   Functional score  mRS =  0   0 - No symptoms.   1 - No significant disability. Able to carry out all usual activities, despite some symptoms.   2 - Slight disability. Able to look after own affairs without assistance, but unable to carry out all previous activities.   3 - Moderate disability. Requires some help, but able to walk unassisted.   4 - Moderately severe disability. Unable to attend to own bodily needs without assistance, and unable to walk unassisted.   5 - Severe disability. Requires constant nursing care and attention, bedridden, incontinent.   6 - Dead.   NIH Stroke Scale = 0   Data reviewed: I personally reviewed the images and agree with the radiology interpretations.  Ct Head Wo Contrast 07/19/2015 Acute left thalamic hemorrhage as described above. Volume is estimated at 3.6 cc.   Ct Angio Head & Neck W/cm &/or Wo/cm 07/19/2015 1. Negative for CTA spot sign or evidence of vascular malformation associated with the left thalamic region hemorrhage. 2. 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 No anterior circulation large vessel stenosis despite plaque, but there is bilateral MCA M2/M3 mild to moderate irregularity and stenosis. 3. Posterior circulation remarkable for moderate to severe stenosis at the right vertebral artery origin, and mild stenosis at the left PCA origin. 4. There is a large cystic right neck mass measuring 5.7 cm. 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 ENT follow-up is necessary.   CTA Chest/Abd/Pelvis 07/20/2015  1. No acute vascular findings in the chest and abdomen or pelvis. No evidence of aortic dissection or aneurysm. Minimal atherosclerosis noted. 2. Probable left ventricular hypertrophy. 3. Left renal cysts, including a small deform it lesion in  the lower pole the left kidney. 4. Hepatic steatosis and small hiatal hernia.  2D echo  - Normal LV systolic function; grade 1 diastolic dysfunction with elevated LV filliing pressure; moderate LVH; mild LAE; mild AI; echodensity in aortic arch/descending aorta (? Reverberation; cannot exclude dissection); suggest CTA or MRA to further assess.  Component     Latest Ref Rng 07/19/2015  Cholesterol     0 - 200 mg/dL 193  Triglycerides     <150 mg/dL 104  HDL Cholesterol     >40 mg/dL 36 (L)  Total CHOL/HDL Ratio      5.4  VLDL     0 - 40 mg/dL 21  LDL (calc)     0 - 99 mg/dL 136 (H)  Hemoglobin A1C     4.8 - 5.6 % 5.3  Mean Plasma Glucose      105    Assessment: As you may recall, he is a 55 y.o. Caucasian male with PMH of hypertension and benign right neck cyst was admitted on 07/19/15 for altered mental status and right-sided weakness. CT showed a left thalamic ICH. 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 CTA aorta no dissection, but left renal cysts and  LVH. TTE negative. LDL 136 and A1C 5.3. BP was high on arrival, put on cleviprex and labetalol drip, but later controlled with PO meds. Put on lipitor 20mg  and d/c to CIR. He stayed in CIR for 4-5 days before discharging home. He is back to baseline now. Finished outpt PT/OT. His BP at home around 130s/70s. Compliant with medication. He follows with PCP for right neck cystic lesion. He agrees with sleep study.   Plan:  - start to take ASA 81mg  daily for stroke prevention - continue lipitor 20mg  for stroke prevention - Follow up with your primary care physician for stroke risk factor modification. Recommend maintain blood pressure goal <130/80, diabetes with hemoglobin A1c goal below 6.5% and lipids with LDL cholesterol goal below 70 mg/dL.  - will do sleep study to rule out sleep apnea - follow up with Dr.  Doree Fudge for neck cystic lesion - standing up and sitting up slowly to avoid lightheadedness - check BP and record - No restriction for driving, but recommend to drive during the day not at night, no long distance and drive in familiar roads. - follow up in 4 months.   I spent more than 25 minutes of face to face time with the patient. Greater than 50% of time was spent in counseling and coordination of care.    Orders Placed This Encounter  Procedures  . Ambulatory referral to Sleep Studies    Referral Priority:  Routine    Referral Type:  Consultation    Referral Reason:  Specialty Services Required    Number of Visits Requested:  1    Meds ordered this encounter  Medications  . aspirin EC 81 MG tablet    Sig: Take 1 tablet (81 mg total) by mouth daily.    Dispense:  90 tablet    Refill:  3    Patient Instructions  - start to take ASA 81mg  daily for stroke prevention - continue lipitor 20mg  for stroke prevention - Follow up with your primary care physician for stroke risk factor modification. Recommend maintain blood pressure goal <130/80, diabetes with hemoglobin A1c goal below 6.5% and lipids with LDL cholesterol goal below 70 mg/dL.  - will do sleep study to rule out sleep apnea - follow up with Dr. Doree Fudge for neck cystic lesion - standing up and sitting up slowly to avoid lightheadedness - check BP and record - No restriction for driving, but recommend to drive during the day not at night, no long distance and drive in familiar roads. - follow up in 4 months.     Rosalin Hawking, MD PhD [facility] [address], [place], [place] [address] [phone]

## 2015-09-10 NOTE — Patient Instructions (Addendum)
-   start to take ASA 81mg  daily for stroke prevention - continue lipitor 20mg  for stroke prevention - Follow up with your primary care physician for stroke risk factor modification. Recommend maintain blood pressure goal <130/80, diabetes with hemoglobin A1c goal below 6.5% and lipids with LDL cholesterol goal below 70 mg/dL.  - will do sleep study to rule out sleep apnea - follow up with Dr. Doree Fudge for neck cystic lesion - standing up and sitting up slowly to avoid lightheadedness - check BP and record - No restriction for driving, but recommend to drive during the day not at night, no long distance and drive in familiar roads. - follow up in 4 months.

## 2015-09-24 ENCOUNTER — Institutional Professional Consult (permissible substitution): Payer: 59 | Admitting: Neurology

## 2015-10-12 ENCOUNTER — Telehealth: Payer: Self-pay

## 2015-10-12 ENCOUNTER — Institutional Professional Consult (permissible substitution): Payer: 59 | Admitting: Neurology

## 2015-10-12 NOTE — Telephone Encounter (Signed)
Patient did not show to new sleep consult appt.  

## 2015-10-13 ENCOUNTER — Encounter: Payer: Self-pay | Admitting: Neurology

## 2015-12-03 ENCOUNTER — Other Ambulatory Visit: Payer: Self-pay | Admitting: Otolaryngology

## 2015-12-03 DIAGNOSIS — R221 Localized swelling, mass and lump, neck: Secondary | ICD-10-CM

## 2015-12-11 ENCOUNTER — Ambulatory Visit
Admission: RE | Admit: 2015-12-11 | Discharge: 2015-12-11 | Disposition: A | Discharge status: Home / Self Care | Payer: 59 | Source: Ambulatory Visit | Attending: Otolaryngology | Admitting: Otolaryngology

## 2015-12-11 DIAGNOSIS — R221 Localized swelling, mass and lump, neck: Secondary | ICD-10-CM

## 2015-12-11 MED ORDER — IOPAMIDOL (ISOVUE-300) INJECTION 61%
75.0000 mL | Freq: Once | INTRAVENOUS | Status: AC | PRN
  Administered 2015-12-11: 75 mL via INTRAVENOUS

## 2016-01-11 ENCOUNTER — Ambulatory Visit (INDEPENDENT_AMBULATORY_CARE_PROVIDER_SITE_OTHER): Payer: 59 | Admitting: Neurology

## 2016-01-11 ENCOUNTER — Encounter: Payer: Self-pay | Admitting: Neurology

## 2016-01-11 VITALS — BP 172/97 | HR 69 | Ht 71.0 in | Wt 204.0 lb

## 2016-01-11 DIAGNOSIS — F411 Generalized anxiety disorder: Secondary | ICD-10-CM

## 2016-01-11 DIAGNOSIS — I610 Nontraumatic intracerebral hemorrhage in hemisphere, subcortical: Secondary | ICD-10-CM

## 2016-01-11 DIAGNOSIS — I10 Essential (primary) hypertension: Secondary | ICD-10-CM

## 2016-01-11 NOTE — Patient Instructions (Addendum)
-   continue BP meds and check BP at home - Follow up with your primary care physician for stroke risk factor modification. Recommend maintain blood pressure goal <130/80, diabetes with hemoglobin A1c goal below 6.5% and lipids with LDL cholesterol goal below 70 mg/dL.  - follow up with surgery for neck cystic lesion. May consider Xanax for white coat syndrome.  - follow up in 6 months.

## 2016-01-11 NOTE — Progress Notes (Signed)
STROKE NEUROLOGY FOLLOW UP NOTE  NAME: Glenn Liu DOB: Feb 08, 1961  REASON FOR VISIT: stroke follow up HISTORY FROM: pt and chart  Today we had the pleasure of seeing Glenn Liu in follow-up at our Neurology Clinic. Pt was accompanied by no one.   History Summary [name] is a 55 y.o. male with history of hypertension and benign right neck cyst was admitted on 07/19/15 for altered mental status and right-sided weakness. CT showed a left thalamic ICH. 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 CTA aorta no dissection, but left renal cysts and LVH. TTE negative. LDL 136 and A1C 5.3. BP was high on arrival, put on cleviprex and labetalol drip, but later controlled with PO meds. Put on lipitor 20mg  and d/c to CIR.  09/10/15 follow up - the patient has been doing well. He is back to his baseline. He stayed in CIR for 4-5 days before discharging home. He has lost 27lbs intentionally. He finished outpt PT/OT. He complains of lightheadedness if standing or sitting up too fast. His BP at home around 130s/70s. Compliant with medication. He follows with PCP for right neck cystic lesion. He agrees with sleep study. Today BP 152/88.  Interval History During the interval time, he has been doing well. Not able to tolerate ASA due to rectal bleeding. He stopped ASA by himself. Not on lipitor any more due to low LDL at 45 and b/l leg pain. He intentionally lost 70lbs and no more snoring as per pt so he cancelled his sleepy study consult. He follows with surgery for right neck cystic lesion but due to white coat syndrome he was not offered surgery. He was put on lexapro 3 weeks ago. His latest blood test including A1C was 4.8 and LDL 45. Today his BP 172/97 but his record at home says 130-150 everyday.   REVIEW OF SYSTEMS: Full 14 system review of systems performed and notable only  for those listed below and in HPI above, all others are negative:  Constitutional:   Activity change, appetite change Cardiovascular:  Ear/Nose/Throat:   Skin:  Eyes:   Respiratory:   Gastroitestinal:   Genitourinary:  Hematology/Lymphatic:   Endocrine:  Musculoskeletal:   Allergy/Immunology:   Neurological:  Walking difficulty Psychiatric:  Sleep:   The following represents the patient's updated allergies and side effects list: No Known Allergies  The neurologically relevant items on the patient's problem list were reviewed on today's visit.  Neurologic Examination  A problem focused neurological exam (12 or more points of the single system neurologic examination, vital signs counts as 1 point, cranial nerves count for 8 points) was performed.  Blood pressure (!) 172/97, pulse 69, height 5\' 11"  (1.803 m), weight 204 lb (92.5 kg).  General - Well nourished, well developed, in no apparent distress.  Ophthalmologic - Sharp disc margins OU. Fundi not visualized due to .  Cardiovascular - Regular rate and rhythm with no murmur.  Mental Status -  Level of arousal and orientation to time, place, and person were intact. Language including expression, naming, repetition, comprehension was assessed and found intact. Attention span and concentration were normal. Recent and remote memory were intact. Fund of Knowledge was assessed and was intact.  Cranial Nerves II - XII - II - Visual field intact OU. III, IV, VI - Extraocular movements intact. V - Facial sensation intact bilaterally.  VII - Facial movement intact bilaterally. VIII - Hearing & vestibular intact bilaterally. X - Palate elevates symmetrically. XI - Chin turning & shoulder shrug intact bilaterally. XII - Tongue protrusion intact.  Motor Strength - The patient's strength was normal in all extremities and pronator drift was absent.  Bulk was normal and fasciculations were absent.   Motor Tone - Muscle tone was  assessed at the neck and appendages and was normal.  Reflexes - The patient's reflexes were 1+ in all extremities and he had no pathological reflexes.  Sensory - Light touch, temperature/pinprick, vibration and proprioception, and Romberg testing were assessed and were normal.    Coordination - The patient had normal movements in the hands and feet with no ataxia or dysmetria.  Tremor was absent.  Gait and Station - The patient's transfers, posture, gait, station, and turns were observed as normal.   Data reviewed: I personally reviewed the images and agree with the radiology interpretations.  Ct Head Wo Contrast 07/19/2015 Acute left thalamic hemorrhage as described above. Volume is estimated at 3.6 cc.   Ct Angio Head & Neck W/cm &/or Wo/cm 07/19/2015 1. Negative for CTA spot sign or evidence of vascular malformation associated with the left thalamic region hemorrhage. 2. 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 No anterior circulation large vessel stenosis despite plaque, but there is bilateral MCA M2/M3 mild to moderate irregularity and stenosis. 3. Posterior circulation remarkable for moderate to severe stenosis at the right vertebral artery origin, and mild stenosis at the left PCA origin. 4. There is a large cystic right neck mass measuring 5.7 cm. 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 ENT follow-up is necessary.   CTA Chest/Abd/Pelvis 07/20/2015  1. No acute vascular findings in the chest and abdomen or pelvis. No evidence of aortic dissection or aneurysm. Minimal atherosclerosis noted. 2. Probable left ventricular hypertrophy. 3. Left renal cysts, including a small deform it lesion in the lower pole the left kidney. 4. Hepatic steatosis and small hiatal  hernia.  2D echo  - Normal LV systolic function; grade 1 diastolic dysfunction with elevated LV filliing pressure; moderate LVH; mild LAE; mild AI; echodensity in aortic arch/descending aorta (? Reverberation; cannot exclude dissection); suggest CTA or MRA to further assess.   Component     Latest Ref Rng 07/19/2015  Cholesterol     0 - 200 mg/dL 193  Triglycerides     <150 mg/dL 104  HDL Cholesterol     >40 mg/dL 36 (L)  Total CHOL/HDL Ratio      5.4  VLDL     0 - 40 mg/dL 21  LDL (calc)     0 - 99 mg/dL 136 (H)  Hemoglobin A1C     4.8 - 5.6 % 5.3  Mean Plasma Glucose      105   12/24/15 Cre 1.32, LDL 45, HDL 34, TG 53, A1C 4.8  Assessment: As you may recall, he is a 55 y.o. Caucasian male with PMH of hypertension and benign right neck cyst was admitted on 07/19/15 for altered mental status and right-sided weakness. CT showed a left thalamic ICH. 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  CTA aorta no dissection, but left renal cysts and LVH. TTE negative. LDL 136 and A1C 5.3. BP was high on arrival, put on cleviprex and labetalol drip, but later controlled with PO meds. Put on lipitor 20mg  and d/c to CIR. He stayed in CIR for 4-5 days before discharging home. He is back to baseline now. Finished outpt PT/OT. His BP at home around 130s/70s, but he dose have white coat syndrome for which he was not offered surgery for his neck cystic lesion. On lexapro now and compliant with BP medication. His recent labs are unremarkable. Stopped ASA due to rectal bleeding and stopped lipitor due to leg pain and low LDL. He lost 70lbs and no more snoring so he cancelled his sleep study.   Plan:  - continue BP meds and check BP at home - Follow up with your primary care physician for stroke risk factor modification. Recommend maintain blood pressure goal <130/80, diabetes with  hemoglobin A1c goal below 6.5% and lipids with LDL cholesterol goal below 70 mg/dL.  - follow up with surgery for neck cystic lesion. May consider Xanax for white coat syndrome.  - follow up in 6 months.   No orders of the defined types were placed in this encounter.   Meds ordered this encounter  Medications  . escitalopram (LEXAPRO) 5 MG tablet  . cloNIDine (CATAPRES) 0.1 MG tablet    Patient Instructions  - continue BP meds and check BP at home - Follow up with your primary care physician for stroke risk factor modification. Recommend maintain blood pressure goal <130/80, diabetes with hemoglobin A1c goal below 6.5% and lipids with LDL cholesterol goal below 70 mg/dL.  - follow up with surgery for neck cystic lesion. May consider Xanax for white coat syndrome.  - follow up in 6 months.   Rosalin Hawking, MD PhD [facility] [address] [place], [place] [address] [phone]

## 2016-07-11 ENCOUNTER — Ambulatory Visit: Payer: 59 | Admitting: Neurology

## 2016-07-11 NOTE — Telephone Encounter (Signed)
Pt was already contacted to r/s today's appt. 

## 2016-07-12 ENCOUNTER — Ambulatory Visit: Payer: Self-pay | Admitting: Neurology

## 2016-07-12 ENCOUNTER — Telehealth: Payer: Self-pay | Admitting: *Deleted

## 2016-07-12 NOTE — Telephone Encounter (Signed)
Patient schedule for 07/13/2016 with Dr. Erlinda Hong per appts.

## 2016-07-12 NOTE — Telephone Encounter (Signed)
Called and LVM. Advised office closed, no power. Have to cx apt today. Once office back open, Dr Erlinda Hong nurse will call back to r/s. Apologized for any inconvenience.

## 2016-07-13 ENCOUNTER — Encounter: Payer: Self-pay | Admitting: Neurology

## 2016-07-13 ENCOUNTER — Ambulatory Visit (INDEPENDENT_AMBULATORY_CARE_PROVIDER_SITE_OTHER): Payer: 59 | Admitting: Neurology

## 2016-07-13 VITALS — BP 175/98 | HR 57 | Ht 71.0 in | Wt 209.6 lb

## 2016-07-13 DIAGNOSIS — I10 Essential (primary) hypertension: Secondary | ICD-10-CM

## 2016-07-13 DIAGNOSIS — E785 Hyperlipidemia, unspecified: Secondary | ICD-10-CM | POA: Diagnosis not present

## 2016-07-13 DIAGNOSIS — I610 Nontraumatic intracerebral hemorrhage in hemisphere, subcortical: Secondary | ICD-10-CM

## 2016-07-13 NOTE — Patient Instructions (Signed)
-   continue BP meds and check BP at home - Follow up with your primary care physician for stroke risk factor modification. Recommend maintain blood pressure goal <140/90, diabetes with hemoglobin A1c goal below 6.5% and lipids with LDL cholesterol goal below 70 mg/dL.  - regular exercise and healthy diet - follow up as needed.

## 2016-07-13 NOTE — Progress Notes (Signed)
STROKE NEUROLOGY FOLLOW UP NOTE  NAME: [name] DOB: 05-21-1960  REASON FOR VISIT: stroke follow up HISTORY FROM: pt and chart  Today we had Glenn pleasure of seeing Glenn Liu in follow-up at our Neurology Clinic. Pt was accompanied by no one.   History Summary Glenn Liu is a 56 y.o. male with history of hypertension and benign right neck cyst was admitted on 07/19/15 for altered mental status and right-sided weakness. CT showed a left thalamic ICH. 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 CTA aorta no dissection, but left renal cysts and LVH. TTE negative. LDL 136 and A1C 5.3. BP was high on arrival, put on cleviprex and labetalol drip, but later controlled with PO meds. Put on lipitor 20mg  and d/c to CIR.  09/10/15 follow up - Glenn Liu has been doing well. Glenn Liu is back to his baseline. Glenn Liu stayed in CIR for 4-5 days before discharging home. Glenn Liu has lost 27lbs intentionally. Glenn Liu finished outpt PT/OT. Glenn Liu complains of lightheadedness if standing or sitting up too fast. His BP at home around 130s/70s. Compliant with medication. Glenn Liu follows with PCP for right neck cystic lesion. Glenn Liu agrees with sleep study. Today BP 152/88.  01/11/16 follow up - Glenn Liu has been doing well. Not able to tolerate ASA due to rectal bleeding. Glenn Liu stopped ASA by himself. Not on lipitor any more due to low LDL at 45 and b/l leg pain. Glenn Liu intentionally lost 70lbs and no more snoring as per pt so Glenn Liu cancelled his sleepy study consult. Glenn Liu follows with surgery for right neck cystic lesion but due to white coat syndrome Glenn Liu was not offered surgery. Glenn Liu was put on lexapro 3 weeks ago. His latest blood test including A1C was 4.8 and LDL 45. Today his BP 172/97 but his record at home says 130-150 everyday.   Interval History During Glenn interval time, Glenn Liu has been doing well. Glenn Liu had neck mass removal in  05/2016 and was told Glenn pathology showed cyst without malignancy. Glenn Liu continues to have white coat syndrome and his BP was high in hospital and clinic, however, at home always 130-140s. Today BP in clinic 175/98. Glenn Liu is on 3 BP meds. Otherwise, Glenn Liu is doing well.   REVIEW OF SYSTEMS: Full 14 system review of systems performed and notable only for those listed below and in HPI above, all others are negative:  Constitutional:   Activity change, appetite change Cardiovascular:  Ear/Nose/Throat:   Skin:  Eyes:   Respiratory:   Gastroitestinal:   Genitourinary:  Hematology/Lymphatic:   Endocrine:  Musculoskeletal:   Allergy/Immunology:   Neurological:   Psychiatric:  Sleep:   Glenn following represents Glenn Liu's updated allergies and side effects list: No Known Allergies  Glenn neurologically relevant items on Glenn Liu's problem list were reviewed on today's visit.  Neurologic Examination  A problem focused neurological exam (12 or more points of Glenn single system neurologic examination, vital signs counts as 1 point, cranial nerves count for 8 points) was performed.  Blood pressure (!) 175/98, pulse (!) 57, height 5\' 11"  (1.803 m), weight 209 lb 9.6 oz (95.1 kg).  General - Well nourished, well developed, in no apparent distress.  Ophthalmologic - Sharp disc margins OU. Fundi not visualized due to .  Cardiovascular - Regular rate and rhythm with no murmur.  Mental Status -  Level of arousal  and orientation to time, place, and person were intact. Language including expression, naming, repetition, comprehension was assessed and found intact. Attention span and concentration were normal. Recent and remote memory were intact. Fund of Knowledge was assessed and was intact.  Cranial Nerves II - XII - II - Visual field intact OU. III, IV, VI - Extraocular movements intact. V - Facial sensation intact bilaterally. VII - Facial movement intact bilaterally. VIII - Hearing &  vestibular intact bilaterally. X - Palate elevates symmetrically. XI - Chin turning & shoulder shrug intact bilaterally. XII - Tongue protrusion intact.  Motor Strength - Glenn Liu's strength was normal in all extremities and pronator drift was absent.  Bulk was normal and fasciculations were absent.   Motor Tone - Muscle tone was assessed at Glenn neck and appendages and was normal.  Reflexes - Glenn Liu's reflexes were 1+ in all extremities and Glenn Liu had no pathological reflexes.  Sensory - Light touch, temperature/pinprick, vibration and proprioception, and Romberg testing were assessed and were normal.    Coordination - Glenn Liu had normal movements in Glenn hands and feet with no ataxia or dysmetria.  Tremor was absent.  Gait and Station - Glenn Liu's transfers, posture, gait, station, and turns were observed as normal.   Data reviewed: I personally reviewed Glenn images and agree with Glenn radiology interpretations.  Ct Head Wo Contrast 07/19/2015 Acute left thalamic hemorrhage as described above. Volume is estimated at 3.6 cc.   Ct Angio Head & Neck W/cm &/or Wo/cm 07/19/2015 1. Negative for CTA spot sign or evidence of vascular malformation associated with Glenn left thalamic region hemorrhage. 2. 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 No anterior circulation large vessel stenosis despite plaque, but there is bilateral MCA M2/M3 mild to moderate irregularity and stenosis. 3. Posterior circulation remarkable for moderate to severe stenosis at Glenn right vertebral artery origin, and mild stenosis at Glenn left PCA origin. 4. There is a large cystic right neck mass measuring 5.7 cm. 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 ENT follow-up is necessary.   CTA  Chest/Abd/Pelvis 07/20/2015  1. No acute vascular findings in Glenn chest and abdomen or pelvis. No evidence of aortic dissection or aneurysm. Minimal atherosclerosis noted. 2. Probable left ventricular hypertrophy. 3. Left renal cysts, including a small deform it lesion in Glenn lower pole Glenn left kidney. 4. Hepatic steatosis and small hiatal hernia.  2D echo  - Normal LV systolic function; grade 1 diastolic dysfunction with elevated LV filliing pressure; moderate LVH; mild LAE; mild AI; echodensity in aortic arch/descending aorta (? Reverberation; cannot exclude dissection); suggest CTA or MRA to further assess.   Component     Latest Ref Rng 07/19/2015  Cholesterol     0 - 200 mg/dL 193  Triglycerides     <150 mg/dL 104  HDL Cholesterol     >40 mg/dL 36 (L)  Total CHOL/HDL Ratio      5.4  VLDL     0 - 40 mg/dL 21  LDL (calc)     0 - 99 mg/dL 136 (H)  Hemoglobin A1C     4.8 - 5.6 % 5.3  Mean Plasma Glucose      105   12/24/15 Cre 1.32, LDL 45, HDL 34, TG 53, A1C 4.8  Assessment: As  you may recall, Glenn Liu is a 56 y.o. Caucasian male with PMH of hypertension and benign right neck cyst was admitted on 07/19/15 for altered mental status and right-sided weakness. CT showed a left thalamic ICH. 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 CTA aorta no dissection, but left renal cysts and LVH. TTE negative. LDL 136 and A1C 5.3. BP was high on arrival, put on cleviprex and labetalol drip, but later controlled with PO meds. Put on lipitor 20mg  and d/c to CIR. Glenn Liu stayed in CIR for 4-5 days before discharging home. Glenn Liu is back to baseline now. Finished outpt PT/OT. His BP at home around 130s/70s, but Glenn Liu dose have white coat syndrome in clinic or hospital. On lexapro now and compliant with BP medication. His recent labs are unremarkable. Stopped ASA due to rectal bleeding and stopped  lipitor due to leg pain and low LDL. Glenn Liu lost 70lbs and no more snoring so Glenn Liu cancelled his sleep study. Had right neck mass removal in 05/2014 and pathology consistent with cyst.   Plan:  - continue BP meds and check BP at home - Follow up with your primary care physician for stroke risk factor modification. Recommend maintain blood pressure goal <140/90, diabetes with hemoglobin A1c goal below 6.5% and lipids with LDL cholesterol goal below 70 mg/dL.  - regular exercise and healthy diet - follow up as needed.   No orders of Glenn defined types were placed in this encounter.   Meds ordered this encounter  Medications  . HYDROcodone-acetaminophen (NORCO/VICODIN) 5-325 MG tablet    Sig: Take 1 tablet by mouth every 4 (four) hours as needed.    Refill:  0  . DISCONTD: ALPRAZolam (XANAX) 0.25 MG tablet    Sig: TAKE 1 TABLET BY MOUTH EVERY NIGHT AT BEDTIME BEFORE SURGICAL PROCEDURE    Refill:  0  . DISCONTD: atorvastatin (LIPITOR) 20 MG tablet    Sig: TAKE 1/2 TABLET BY MOUTH ONCE DAILY FOR CHOLESTEROL    Refill:  5  . DISCONTD: cloNIDine (CATAPRES) 0.1 MG tablet    Sig: TAKE 1 TABLET BY MOUTH TWICE (2) DAILY FOR BLOOD PRESSURE  . triamcinolone cream (KENALOG) 0.5 %    Sig: APPLY TO Glenn AFFECTED AREA(S) TWICE (2) DAILY FOR 2 WEEKS, THEN AS NEEDED    Refill:  1    Liu Instructions  - continue BP meds and check BP at home - Follow up with your primary care physician for stroke risk factor modification. Recommend maintain blood pressure goal <140/90, diabetes with hemoglobin A1c goal below 6.5% and lipids with LDL cholesterol goal below 70 mg/dL.  - regular exercise and healthy diet - follow up as needed.   Rosalin Hawking, MD PhD [facility] [address] [place], [place] [address] [phone]

## 2016-09-13 ENCOUNTER — Telehealth: Payer: Self-pay | Admitting: Neurology

## 2016-09-13 NOTE — Telephone Encounter (Signed)
Patient called office in reference to losing his balance with bilateral leg weakness present for about 6-7 months.  After speaking with RN Katrina she advised me to tell patient to fu with his PCP for an evaluation first.  Patient advised and voiced his understanding and appreciation.

## 2016-10-03 ENCOUNTER — Encounter: Payer: Self-pay | Admitting: Neurology

## 2016-10-03 ENCOUNTER — Ambulatory Visit (INDEPENDENT_AMBULATORY_CARE_PROVIDER_SITE_OTHER): Payer: 59 | Admitting: Neurology

## 2016-10-03 VITALS — BP 178/96 | HR 58 | Ht 71.0 in | Wt 214.6 lb

## 2016-10-03 DIAGNOSIS — F411 Generalized anxiety disorder: Secondary | ICD-10-CM

## 2016-10-03 DIAGNOSIS — E785 Hyperlipidemia, unspecified: Secondary | ICD-10-CM

## 2016-10-03 DIAGNOSIS — I610 Nontraumatic intracerebral hemorrhage in hemisphere, subcortical: Secondary | ICD-10-CM

## 2016-10-03 DIAGNOSIS — I10 Essential (primary) hypertension: Secondary | ICD-10-CM | POA: Diagnosis not present

## 2016-10-03 NOTE — Patient Instructions (Signed)
-   continue BP meds - check BP at home at least twice a day and record - may consider lexapro for help of anxiety - relaxation and de-stress are the key - Follow up with your primary care physician for stroke risk factor modification. Recommend maintain blood pressure goal <140/90, diabetes with hemoglobin A1c goal below 6.5% and lipids with LDL cholesterol goal below 70 mg/dL.  - regular exercise and healthy diet - follow up in 4 months.

## 2016-10-03 NOTE — Progress Notes (Signed)
STROKE NEUROLOGY FOLLOW UP NOTE  NAME: Glenn Liu DOB: 10-07-1960  REASON FOR VISIT: stroke follow up HISTORY FROM: pt and chart  Today we had the pleasure of seeing Glenn Liu in follow-up at our Neurology Clinic. Pt was accompanied by no one.   History Summary [name] is a 56 y.o. male with history of hypertension and benign right neck cyst was admitted on 07/19/15 for altered mental status and right-sided weakness. CT showed a left thalamic ICH. 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 CTA aorta no dissection, but left renal cysts and LVH. TTE negative. LDL 136 and A1C 5.3. BP was high on arrival, put on cleviprex and labetalol drip, but later controlled with PO meds. Put on lipitor 20mg  and d/c to CIR.  09/10/15 follow up - the patient has been doing well. He is back to his baseline. He stayed in CIR for 4-5 days before discharging home. He has lost 27lbs intentionally. He finished outpt PT/OT. He complains of lightheadedness if standing or sitting up too fast. His BP at home around 130s/70s. Compliant with medication. He follows with PCP for right neck cystic lesion. He agrees with sleep study. Today BP 152/88.  01/11/16 follow up - he has been doing well. Not able to tolerate ASA due to rectal bleeding. He stopped ASA by himself. Not on lipitor any more due to low LDL at 45 and b/l leg pain. He intentionally lost 70lbs and no more snoring as per pt so he cancelled his sleepy study consult. He follows with surgery for right neck cystic lesion but due to white coat syndrome he was not offered surgery. He was put on lexapro 3 weeks ago. His latest blood test including A1C was 4.8 and LDL 45. Today his BP 172/97 but his record at home says 130-150 everyday.   07/13/16 follow up - he has been doing well. He had neck mass removal in 05/2016 and was told the  pathology showed cyst without malignancy. He continues to have white coat syndrome and his BP was high in hospital and clinic, however, at home always 130-140s. Today BP in clinic 175/98. He is on 3 BP meds. Otherwise, he is doing well.   Interval History During the interval time, pt has been doing well until 08/2016. Pt gradually started to feel abnormal feelings and weakness of both legs, like somebody pressing on the legs, also felt difficulty with walking but still able to walk and no fall. He went to see PCP and found to have high BP up to 220s. His clonidine changed to labetalol but BP still not in good control, fluctuating at home, SBP at 180s. However, his leg weakness seems improved. Yesterday, he had episode of clammy, sweating, cold, confusion and felt like to pass out. BP was 155/100 and glucose 112. Today BP 178/96. Had CTA head no acute findings. MRI old lacunar right thalamus and previous ICH left thalamus, no acute findings.   He admitted that he has a lot of stress lately. He has upcoming [place] trip to [place] this month and he is stressed out about BP and what happened.   REVIEW OF SYSTEMS: Full 14 system review of systems performed and notable only for those listed below and in HPI above, all others are negative:  Constitutional:   Activity change, appetite change Cardiovascular:  Ear/Nose/Throat:  Skin:  Eyes:   Respiratory:   Gastroitestinal:   Genitourinary:  Hematology/Lymphatic:   Endocrine:  Musculoskeletal:  Walking difficulty Allergy/Immunology:   Neurological:   Psychiatric:  Sleep:   The following represents the patient's updated allergies and side effects list: No Known Allergies  The neurologically relevant items on the patient's problem list were reviewed on today's visit.  Neurologic Examination  A problem focused neurological exam (12 or more points of the single system neurologic examination, vital signs counts as 1 point, cranial nerves  count for 8 points) was performed.  Blood pressure (!) 178/96, pulse (!) 58, height 5\' 11"  (1.803 m), weight 214 lb 9.6 oz (97.3 kg).  General - Well nourished, well developed, in no apparent distress.  Ophthalmologic - Sharp disc margins OU. Fundi not visualized due to .  Cardiovascular - Regular rate and rhythm with no murmur.  Mental Status -  Level of arousal and orientation to time, place, and person were intact. Language including expression, naming, repetition, comprehension was assessed and found intact. Attention span and concentration were normal. Recent and remote memory were intact. Fund of Knowledge was assessed and was intact.  Cranial Nerves II - XII - II - Visual field intact OU. III, IV, VI - Extraocular movements intact. V - Facial sensation intact bilaterally. VII - Facial movement intact bilaterally. VIII - Hearing & vestibular intact bilaterally. X - Palate elevates symmetrically. XI - Chin turning & shoulder shrug intact bilaterally. XII - Tongue protrusion intact.  Motor Strength - The patient's strength was normal in all extremities and pronator drift was absent.  Bulk was normal and fasciculations were absent.   Motor Tone - Muscle tone was assessed at the neck and appendages and was normal.  Reflexes - The patient's reflexes were 1+ in all extremities and he had no pathological reflexes.  Sensory - Light touch, temperature/pinprick, vibration and proprioception, and Romberg testing were assessed and were normal.    Coordination - The patient had normal movements in the hands and feet with no ataxia or dysmetria.  Tremor was absent.  Gait and Station - The patient's transfers, posture, gait, station, and turns were observed as normal.   Data reviewed: I personally reviewed the images and agree with the radiology interpretations.  Ct Head Wo Contrast 07/19/2015 Acute left thalamic hemorrhage as described above. Volume is estimated at 3.6 cc.   Ct  Angio Head & Neck W/cm &/or Wo/cm 07/19/2015 1. Negative for CTA spot sign or evidence of vascular malformation associated with the left thalamic region hemorrhage. 2. 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 No anterior circulation large vessel stenosis despite plaque, but there is bilateral MCA M2/M3 mild to moderate irregularity and stenosis. 3. Posterior circulation remarkable for moderate to severe stenosis at the right vertebral artery origin, and mild stenosis at the left PCA origin. 4. There is a large cystic right neck mass measuring 5.7 cm. 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 ENT follow-up is necessary.   CTA Chest/Abd/Pelvis 07/20/2015  1. No acute vascular findings in the chest and abdomen or pelvis. No evidence of aortic dissection or aneurysm. Minimal atherosclerosis noted. 2. Probable left ventricular hypertrophy. 3. Left renal cysts, including a small deform it lesion in the lower pole the left kidney. 4. Hepatic steatosis and small  hiatal hernia.  2D echo  - Normal LV systolic function; grade 1 diastolic dysfunction with elevated LV filliing pressure; moderate LVH; mild LAE; mild AI; echodensity in aortic arch/descending aorta (? Reverberation; cannot exclude dissection); suggest CTA or MRA to further assess.  CTA head 09/16/16 Stable to slightly improved intracranial athero. No detectable residue of previous left thalamic hemorrhage.   MRI brain 09/29/16 1. Chronic small vessel disease including sequelae of the 2017 left thalamic hemorrhage, and chronic lacunar infarcts in the right thalamus and left pons.  2. No superimposed acute intracranial abnormalities.    Component     Latest Ref Rng 07/19/2015  Cholesterol     0 - 200 mg/dL 193  Triglycerides      <150 mg/dL 104  HDL Cholesterol     >40 mg/dL 36 (L)  Total CHOL/HDL Ratio      5.4  VLDL     0 - 40 mg/dL 21  LDL (calc)     0 - 99 mg/dL 136 (H)  Hemoglobin A1C     4.8 - 5.6 % 5.3  Mean Plasma Glucose      105   12/24/15 Cre 1.32, LDL 45, HDL 34, TG 53, A1C 4.8  Assessment: As you may recall, he is a 56 y.o. Caucasian male with PMH of hypertension and benign right neck cyst was admitted on 07/19/15 for altered mental status and right-sided weakness. CT showed a left thalamic ICH. 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 CTA aorta no dissection, but left renal cysts and LVH. TTE negative. LDL 136 and A1C 5.3. BP was high on arrival, put on cleviprex and labetalol drip, but later controlled with PO meds. Put on lipitor 20mg  and d/c to CIR. He stayed in CIR for 4-5 days before discharging home. He is back to baseline now. Finished outpt PT/OT. His BP at home around 130s/70s, but he dose have white coat syndrome in clinic or hospital. On lexapro now and compliant with BP medication. His recent labs are unremarkable. Stopped ASA due to rectal bleeding and stopped lipitor due to leg pain and low LDL. He lost 70lbs and no more snoring so he cancelled his sleep study. Had right neck mass removal in 05/2014 and pathology consistent with cyst.   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 No neuro changes at this time. Needs relaxation and better BP control.   Plan:  - continue BP meds - check BP at home at least twice a day and record - may consider lexapro for help of anxiety - relaxation and de-stress are the key - Follow up with your primary care physician for stroke risk factor modification. Recommend maintain blood pressure goal <140/90, diabetes with  hemoglobin A1c goal below 6.5% and lipids with LDL cholesterol goal below 70 mg/dL.  - regular exercise and healthy diet - follow up in 4 months.  I spent more than 25 minutes of face to face time with the patient. Greater than 50% of time was spent in counseling and coordination of care. We discussed BP control, cause of his feeling of leg weakness, and relaxation.   No orders of the defined types were placed in this encounter.   Meds ordered this encounter  Medications  .  DISCONTD: escitalopram (LEXAPRO) 5 MG tablet    Sig: TAKE 1 TABLET BY MOUTH ONCE (1) DAILY    Refill:  5  . DISCONTD: chloroquine (ARALEN) 500 MG tablet  . labetalol (NORMODYNE) 100 MG tablet    Sig: TAKE 2 tablets three times a day    Refill:  0    Patient Instructions  - continue BP meds - check BP at home at least twice a day and record - may consider lexapro for help of anxiety - relaxation and de-stress are the key - Follow up with your primary care physician for stroke risk factor modification. Recommend maintain blood pressure goal <140/90, diabetes with hemoglobin A1c goal below 6.5% and lipids with LDL cholesterol goal below 70 mg/dL.  - regular exercise and healthy diet - follow up in 4 months.   Rosalin Hawking, MD PhD [facility] [address], [place], [place] [address] [phone]

## 2017-02-06 ENCOUNTER — Other Ambulatory Visit: Payer: Self-pay

## 2017-02-06 ENCOUNTER — Encounter (INDEPENDENT_AMBULATORY_CARE_PROVIDER_SITE_OTHER): Payer: Self-pay

## 2017-02-06 ENCOUNTER — Encounter: Payer: Self-pay | Admitting: Neurology

## 2017-02-06 ENCOUNTER — Ambulatory Visit: Payer: 59 | Admitting: Neurology

## 2017-02-06 VITALS — BP 160/72 | HR 75 | Wt 219.6 lb

## 2017-02-06 DIAGNOSIS — I10 Essential (primary) hypertension: Secondary | ICD-10-CM | POA: Diagnosis not present

## 2017-02-06 DIAGNOSIS — F411 Generalized anxiety disorder: Secondary | ICD-10-CM

## 2017-02-06 DIAGNOSIS — I610 Nontraumatic intracerebral hemorrhage in hemisphere, subcortical: Secondary | ICD-10-CM | POA: Diagnosis not present

## 2017-02-06 MED ORDER — ASPIRIN EC 81 MG PO TBEC: 81.0000 mg | DELAYED_RELEASE_TABLET | Freq: Every day | ORAL | Status: AC

## 2017-02-06 NOTE — Patient Instructions (Addendum)
-   will try ASA 81mg  daily again and observe with bleeding precautions - continue BP meds. - check BP at home at least twice a week and record. - relaxation and de-stress yourself, especially before speech on sunday - Follow up with your primary care physician for stroke risk factor modification. Recommend maintain blood pressure goal <140/90, diabetes with hemoglobin A1c goal below 6.5% and lipids with LDL cholesterol goal below 70 mg/dL.  - regular exercise and healthy diet - follow up in 6 months.

## 2017-02-06 NOTE — Progress Notes (Signed)
STROKE NEUROLOGY FOLLOW UP NOTE  NAME: Glenn Liu DOB: 1961/02/01  REASON FOR VISIT: stroke follow up HISTORY FROM: pt and chart  Today we had the pleasure of seeing Shepard Keltz in follow-up at our Neurology Clinic. Pt was accompanied by no one.   History Summary Mr. Zacary Bauer is a 56 y.o. male with history of hypertension and benign right neck cyst was admitted on 07/19/15 for altered mental status and right-sided weakness. CT showed a left thalamic ICH. 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 CTA aorta no dissection, but left renal cysts and LVH. TTE negative. LDL 136 and A1C 5.3. BP was high on arrival, put on cleviprex and labetalol drip, but later controlled with PO meds. Put on lipitor 20mg  and d/c to CIR.  09/10/15 follow up - the patient has been doing well. He is back to his baseline. He stayed in CIR for 4-5 days before discharging home. He has lost 27lbs intentionally. He finished outpt PT/OT. He complains of lightheadedness if standing or sitting up too fast. His BP at home around 130s/70s. Compliant with medication. He follows with PCP for right neck cystic lesion. He agrees with sleep study. Today BP 152/88.  01/11/16 follow up - he has been doing well. Not able to tolerate ASA due to rectal bleeding. He stopped ASA by himself. Not on lipitor any more due to low LDL at 44 and b/l leg pain. He intentionally lost 70lbs and no more snoring as per pt so he cancelled his sleepy study consult. He follows with surgery for right neck cystic lesion but due to white coat syndrome he was not offered surgery. He was put on lexapro 3 weeks ago. His latest blood test including A1C was 4.8 and LDL 45. Today his BP 172/97 but his record at home says 130-150 everyday.   07/13/16 follow up - he has been doing well. He had neck mass removal in 05/2016 and was told the  pathology showed cyst without malignancy. He continues to have white coat syndrome and his BP was high in hospital and clinic, however, at home always 130-140s. Today BP in clinic 175/98. He is on 3 BP meds. Otherwise, he is doing well.   10/03/16 follow up - pt has been doing well until 08/2016. Pt gradually started to feel abnormal feelings and weakness of both legs, like somebody pressing on the legs, also felt difficulty with walking but still able to walk and no fall. He went to see PCP and found to have high BP up to 220s. His clonidine changed to labetalol but BP still not in good control, fluctuating at home, SBP at 180s. However, his leg weakness seems improved. Yesterday, he had episode of clammy, sweating, cold, confusion and felt like to pass out. BP was 155/100 and glucose 112. Today BP 178/96. Had CTA head no acute findings. MRI old lacunar right thalamus and previous ICH left thalamus, no acute findings.  He admitted that he has a lot of stress lately. He has upcoming [place] trip to [place] this month and he is stressed out about BP and what happened.  Interval History During the interval time, he has been doing well. Went to [facility] without issue. 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 Agree to try ASA 81mg  again, last time  d/c due to rectal bleeding. LDL still in good control as per pt. BP today 160/72 but admit "white coat syndrome". Stated that his BP was good at home.   REVIEW OF SYSTEMS: Full 14 system review of systems performed and notable only for those listed below and in HPI above, all others are negative:  Constitutional:   Activity change, appetite change Cardiovascular:  Ear/Nose/Throat:   Skin:  Eyes:   Respiratory:   Gastroitestinal:   Genitourinary:  Hematology/Lymphatic:   Endocrine:  Musculoskeletal:  Walking difficulty Allergy/Immunology:   Neurological:   Psychiatric:    Sleep:   The following represents the patient's updated allergies and side effects list: No Known Allergies  The neurologically relevant items on the patient's problem list were reviewed on today's visit.  Neurologic Examination  A problem focused neurological exam (12 or more points of the single system neurologic examination, vital signs counts as 1 point, cranial nerves count for 8 points) was performed.  Blood pressure (!) 160/72, pulse 75, weight 219 lb 9.6 oz (99.6 kg).  General - Well nourished, well developed, in no apparent distress.  Ophthalmologic - Sharp disc margins OU. Fundi not visualized due to .  Cardiovascular - Regular rate and rhythm with no murmur.  Mental Status -  Level of arousal and orientation to time, place, and person were intact. Language including expression, naming, repetition, comprehension was assessed and found intact. Attention span and concentration were normal. Recent and remote memory were intact. Fund of Knowledge was assessed and was intact.  Cranial Nerves II - XII - II - Visual field intact OU. III, IV, VI - Extraocular movements intact. V - Facial sensation intact bilaterally. VII - Facial movement intact bilaterally. VIII - Hearing & vestibular intact bilaterally. X - Palate elevates symmetrically. XI - Chin turning & shoulder shrug intact bilaterally. XII - Tongue protrusion intact.  Motor Strength - The patient's strength was normal in all extremities and pronator drift was absent.  Bulk was normal and fasciculations were absent.   Motor Tone - Muscle tone was assessed at the neck and appendages and was normal.  Reflexes - The patient's reflexes were 1+ in all extremities and he had no pathological reflexes.  Sensory - Light touch, temperature/pinprick, vibration and proprioception, and Romberg testing were assessed and were normal.    Coordination - The patient had normal movements in the hands and feet with no ataxia or  dysmetria.  Tremor was absent.  Gait and Station - The patient's transfers, posture, gait, station, and turns were observed as normal.   Data reviewed: I personally reviewed the images and agree with the radiology interpretations.  Ct Head Wo Contrast 07/19/2015 Acute left thalamic hemorrhage as described above. Volume is estimated at 3.6 cc.   Ct Angio Head & Neck W/cm &/or Wo/cm 07/19/2015 1. Negative for CTA spot sign or evidence of vascular malformation associated with the left thalamic region hemorrhage. 2. 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 No anterior circulation large vessel stenosis despite plaque, but there is bilateral MCA M2/M3 mild to moderate irregularity and stenosis. 3. Posterior circulation remarkable for moderate to severe stenosis at the right vertebral artery origin, and mild stenosis at the left PCA origin. 4. There is a large cystic right neck mass measuring 5.7 cm. 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 ENT follow-up is necessary.   CTA Chest/Abd/Pelvis 07/20/2015  1. No acute vascular findings in the chest and abdomen or pelvis. No evidence of aortic dissection or aneurysm. Minimal atherosclerosis noted. 2. Probable left ventricular hypertrophy. 3. Left renal cysts, including a small deform it lesion in the lower pole the left kidney. 4. Hepatic steatosis and small hiatal hernia.  2D echo  - Normal LV systolic function; grade 1 diastolic dysfunction with elevated LV filliing pressure; moderate LVH; mild LAE; mild AI; echodensity in aortic arch/descending aorta (? Reverberation; cannot exclude dissection); suggest CTA or MRA to further assess.  CTA head 09/16/16 Stable to slightly improved intracranial athero. No detectable residue of previous  left thalamic hemorrhage.   MRI brain 09/29/16 1. Chronic small vessel disease including sequelae of the 2017 left thalamic hemorrhage, and chronic lacunar infarcts in the right thalamus and left pons.  2. No superimposed acute intracranial abnormalities.    Component     Latest Ref Rng 07/19/2015  Cholesterol     0 - 200 mg/dL 193  Triglycerides     <150 mg/dL 104  HDL Cholesterol     >40 mg/dL 36 (L)  Total CHOL/HDL Ratio      5.4  VLDL     0 - 40 mg/dL 21  LDL (calc)     0 - 99 mg/dL 136 (H)  Hemoglobin A1C     4.8 - 5.6 % 5.3  Mean Plasma Glucose      105   12/24/15 Cre 1.32, LDL 45, HDL 34, TG 53, A1C 4.8  Assessment: As you may recall, he is a 56 y.o. Caucasian male with PMH of hypertension and benign right neck cyst was admitted on 07/19/15 for altered mental status and right-sided weakness. CT showed a left thalamic ICH. 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 CTA aorta no dissection, but left renal cysts and LVH. TTE negative. LDL 136 and A1C 5.3. BP was high on arrival, put on cleviprex and labetalol drip, but later controlled with PO meds. Put on lipitor 20mg  and d/c to CIR. He stayed in CIR for 4-5 days before discharging home. He is back to baseline now. Finished outpt PT/OT. His BP at home around 130s/70s, but he dose have white coat syndrome in clinic or hospital. On lexapro now and compliant with BP medication. His recent labs are unremarkable. Stopped ASA due to rectal bleeding and stopped lipitor due to leg pain and low LDL at 45 in 11/2015. He lost 70lbs and no more snoring so he cancelled his sleep study. Had right neck mass removal in 05/2014 and pathology consistent with cyst.   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 No neuro  changes. 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 Agree to try ASA 81mg  again, last time d/c due to rectal bleeding. LDL still in good control as per pt.   Plan:  - will try ASA 81mg  daily again and observe with bleeding precautions - continue BP meds. - check BP at home at least twice a week and record. - relaxation and de-stress yourself, especially before speech on sunday - Follow up with your  primary care physician for stroke risk factor modification. Recommend maintain blood pressure goal <140/90, diabetes with hemoglobin A1c goal below 6.5% and lipids with LDL cholesterol goal below 70 mg/dL.  - regular exercise and healthy diet - follow up in 6 months.  I spent more than 25 minutes of face to face time with the patient. Greater than 50% of time was spent in counseling and coordination of care. We discussed BP control, restart ASA and relaxation.   No orders of the defined types were placed in this encounter.   Meds ordered this encounter  Medications  . DISCONTD: escitalopram (LEXAPRO) 5 MG tablet    Sig: TAKE 1 TABLET BY MOUTH ONCE (1) DAILY    Refill:  5  . aspirin EC 81 MG tablet    Sig: Take 1 tablet (81 mg total) daily by mouth.    Patient Instructions  - will try ASA 81mg  daily again and observe with bleeding precautions - continue BP meds. - check BP at home at least twice a week and record. - relaxation and de-stress yourself, especially before speech on sunday - Follow up with your primary care physician for stroke risk factor modification. Recommend maintain blood pressure goal <140/90, diabetes with hemoglobin A1c goal below 6.5% and lipids with LDL cholesterol goal below 70 mg/dL.  - regular exercise and healthy diet - follow up in 6 months.   Rosalin Hawking, MD PhD [facility] [address] [place], [place] [address] [phone]

## 2017-08-02 DIAGNOSIS — J039 Acute tonsillitis, unspecified: Secondary | ICD-10-CM | POA: Diagnosis not present

## 2017-08-03 DIAGNOSIS — L578 Other skin changes due to chronic exposure to nonionizing radiation: Secondary | ICD-10-CM | POA: Diagnosis not present

## 2017-08-03 DIAGNOSIS — D045 Carcinoma in situ of skin of trunk: Secondary | ICD-10-CM | POA: Diagnosis not present

## 2017-08-03 DIAGNOSIS — L821 Other seborrheic keratosis: Secondary | ICD-10-CM | POA: Diagnosis not present

## 2017-08-03 DIAGNOSIS — L570 Actinic keratosis: Secondary | ICD-10-CM | POA: Diagnosis not present

## 2017-08-04 NOTE — Progress Notes (Signed)
[facility]  PATIENT: Telford Archambeau DOB: 09-22-1960   REASON FOR VISIT: Follow-up for left thalmic ICH HISTORY FROM: Patient    HISTORY OF PRESENT ILLNESS: History Summary Mr. Adon Gehlhausen is a 57 y.o. male with history of hypertension and benign right neck cyst was admitted on 07/19/15 for altered mental status and right-sided weakness. CT showed a left thalamic ICH. 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 CTA aorta no dissection, but left renal cysts and LVH. TTE negative. LDL 136 and A1C 5.3. BP was high on arrival, put on cleviprex and labetalol drip, but later controlled with PO meds. Put on lipitor 20mg  and d/c to CIR.  09/10/15 follow up - the patient has been doing well. He is back to his baseline. He stayed in CIR for 4-5 days before discharging home. He has lost 27lbs intentionally. He finished outpt PT/OT. He complains of lightheadedness if standing or sitting up too fast. His BP at home around 130s/70s. Compliant with medication. He follows with PCP for right neck cystic lesion. He agrees with sleep study. Today BP 152/88.  01/11/16 follow up - he has been doing well. Not able to tolerate ASA due to rectal bleeding. He stopped ASA by himself. Not on lipitor any more due to low LDL at 45 and b/l leg pain. He intentionally lost 70lbs and no more snoring as per pt so he cancelled his sleepy study consult. He follows with surgery for right neck cystic lesion but due to white coat syndrome he was not offered surgery. He was put on lexapro 3 weeks ago. His latest blood test including A1C was 4.8 and LDL 45. Today his BP 172/97 but his record at home says 130-150 everyday.   07/13/16 follow up - he has been doing well. He had neck mass removal in 05/2016 and was told the pathology showed cyst without malignancy. He continues to have white coat  syndrome and his BP was high in hospital and clinic, however, at home always 130-140s. Today BP in clinic 175/98. He is on 3 BP meds. Otherwise, he is doing well.   10/03/16 follow up - pt has been doing well until 08/2016. Pt gradually started to feel abnormal feelings and weakness of both legs, like somebody pressing on the legs, also felt difficulty with walking but still able to walk and no fall. He went to see PCP and found to have high BP up to 220s. His clonidine changed to labetalol but BP still not in good control, fluctuating at home, SBP at 180s. However, his leg weakness seems improved. Yesterday, he had episode of clammy, sweating, cold, confusion and felt like to pass out. BP was 155/100 and glucose 112. Today BP 178/96. Had CTA head no acute findings. MRI old lacunar right thalamus and previous ICH left thalamus, no acute findings.  He admitted that he has a lot of stress lately. He has upcoming [place] trip to [place] this month and he is stressed out about BP and what happened.  Interval History11/20/18 Dr. Erlinda Hong During the interval time, he has been doing well. Went to [facility] without issue. 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 Agree to try ASA 81mg  again, last time d/c due to rectal bleeding. LDL still in good control as per pt. BP today 160/72  but admit "white coat syndrome". Stated that his BP was good at home.  UPDATE 9/13/2019CM Mr. Hanel, 57 year old male returns for follow-up with history of left thalmic ICH and hypertension.  Blood pressure in the office today 140/80.  He takes his blood pressure at home and it is usually in the 378 systolic range.  He has been doing well and feeling well.  He was restarted on aspirin at his last visit with Dr. Rhunette Croft.  No further stroke or TIA symptoms.  He has minimal bruising and no bleeding.  He continues to complain with dizziness at intervals however he admits  to not eating regularly and never drinking water.  He exercises by lifting weights,  Walking.  He returns for reevaluation REVIEW OF SYSTEMS: Full 14 system review of systems performed and notable only for those listed, all others are neg:  Constitutional: neg  Cardiovascular: neg Ear/Nose/Throat: neg  Skin: neg Eyes: neg Respiratory: neg Gastroitestinal: neg  Hematology/Lymphatic: neg  Endocrine: neg Musculoskeletal:neg Allergy/Immunology: neg Neurological: Occasional dizziness Psychiatric: neg Sleep : neg   ALLERGIES: No Known Allergies  HOME MEDICATIONS: Outpatient Medications Prior to Visit  Medication Sig Dispense Refill  . amLODipine (NORVASC) 10 MG tablet Take 1 tablet (10 mg total) by mouth daily. 30 tablet 1  . aspirin EC 81 MG tablet Take 1 tablet (81 mg total) daily by mouth.    . labetalol (NORMODYNE) 100 MG tablet take one tablet twice a day  0  . lisinopril (PRINIVIL,ZESTRIL) 20 MG tablet Take 1 tablet (20 mg total) by mouth 2 (two) times daily. 60 tablet 1  . tiZANidine (ZANAFLEX) 4 MG tablet 08/07/17 not taking  0  . escitalopram (LEXAPRO) 5 MG tablet 5 mg at bedtime.  1   No facility-administered medications prior to visit.     PAST MEDICAL HISTORY: Past Medical History:  Diagnosis Date  . Hypertension   . Stroke [facility])     PAST SURGICAL HISTORY: History reviewed. No pertinent surgical history.  FAMILY HISTORY: Family History  Problem Relation Age of Onset  . Heart attack Mother   . Stroke Maternal Grandmother     SOCIAL HISTORY: Social History   Socioeconomic History  . Marital status: Married    Spouse name: Not on file  . Number of children: Not on file  . Years of education: Not on file  . Highest education level: Not on file  Occupational History  . Not on file  Social Needs  . Financial resource strain: Not on file  . Food insecurity:    Worry: Not on file    Inability: Not on file  . Transportation needs:    Medical: Not on  file    Non-medical: Not on file  Tobacco Use  . Smoking status: Never Smoker  . Smokeless tobacco: Never Used  Substance and Sexual Activity  . Alcohol use: No  . Drug use: No  . Sexual activity: Not on file  Lifestyle  . Physical activity:    Days per week: Not on file    Minutes per session: Not on file  . Stress: Not on file  Relationships  . Social connections:    Talks on phone: Not on file    Gets together: Not on file    Attends religious service: Not on file    Active member of club or organization: Not on file    Attends meetings of clubs or organizations: Not on file    Relationship status: Not on file  .  Intimate partner violence:    Fear of current or ex partner: Not on file    Emotionally abused: Not on file    Physically abused: Not on file    Forced sexual activity: Not on file  Other Topics Concern  . Not on file  Social History Narrative  . Not on file     PHYSICAL EXAM  Vitals:   08/07/17 0832  BP: (!) 140/80  Pulse: (!) 52  Weight: 225 lb 12.8 oz (102.4 kg)  Height: 5\' 11"  (1.803 m)   Body mass index is 31.49 kg/m.  Generalized: Well developed, in no acute distress  Head: normocephalic and atraumatic,. Oropharynx benign  Neck: Supple, no carotid bruits  Cardiac: Regular rate rhythm, no murmur  Musculoskeletal: No deformity   Neurological examination   Mentation: Alert oriented to time, place, history taking. Attention span and concentration appropriate. Recent and remote memory intact.  Follows all commands speech and language fluent.   Cranial nerve II-XII: Pupils were equal round reactive to light extraocular movements were full, visual field were full on confrontational test. Facial sensation and strength were normal. hearing was intact to finger rubbing bilaterally. Uvula tongue midline. head turning and shoulder shrug were normal and symmetric.Tongue protrusion into cheek strength was normal. Motor: normal bulk and tone, full strength  in the BUE, BLE,  Sensory: normal and symmetric to light touch, pinprick, and  Vibration in the upper and lower extremity  Coordination: finger-nose-finger, heel-to-shin bilaterally, no dysmetria Reflexes: Symmetric upper and lower, plantar responses were flexor bilaterally. Gait and Station: Rising up from seated position without assistance, normal stance,  moderate stride, good arm swing, smooth turning, able to perform tiptoe, and heel walking without difficulty. Tandem gait is steady.  No assistive device  DIAGNOSTIC DATA (LABS, IMAGING, TESTING) - I reviewed patient records, labs, notes, testing and imaging myself where available.  Lab Results  Component Value Date   WBC 7.6 07/23/2015   HGB 15.6 07/23/2015   HCT 43.4 07/23/2015   MCV 81.6 07/23/2015   PLT 124 (L) 07/23/2015      Component Value Date/Time   NA 140 07/23/2015 0452   K 4.1 07/23/2015 0452   CL 108 07/23/2015 0452   CO2 20 (L) 07/23/2015 0452   GLUCOSE 75 07/23/2015 0452   BUN 18 07/23/2015 0452   CREATININE 1.34 (H) 07/23/2015 0452   CALCIUM 8.5 (L) 07/23/2015 0452   PROT 6.6 07/23/2015 0452   ALBUMIN 3.0 (L) 07/23/2015 0452   AST 29 07/23/2015 0452   ALT 14 (L) 07/23/2015 0452   ALKPHOS 61 07/23/2015 0452   BILITOT 1.7 (H) 07/23/2015 0452   GFRNONAA 58 (L) 07/23/2015 0452   GFRAA >60 07/23/2015 0452   Lab Results  Component Value Date   CHOL 193 07/19/2015   HDL 36 (L) 07/19/2015   LDLCALC 136 (H) 07/19/2015   TRIG 104 07/19/2015   CHOLHDL 5.4 07/19/2015   Lab Results  Component Value Date   HGBA1C 5.3 07/19/2015    ASSESSMENT AND PLAN 57 y.o. Caucasian male with PMH of hypertension and benign right neck cyst was admitted on 07/19/15 for altered mental status and right-sided weakness. CT showed a left thalamic ICH. 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 CTA aorta no  dissection, but left renal cysts and LVH. TTE negative. LDL 136 and A1C 5.3. BP was high  on arrival, put on cleviprex and labetalol drip, but later controlled with PO meds. Put on lipitor 20mg  and d/c to CIR. He stayed in CIR for 4-5 days before discharging home. He is back to baseline now. His BP at home around 130s/70s, but he dose have white coat syndrome in clinic or hospital. Compliant with BP medication. His recent labs are unremarkable. Stopped ASA due to rectal bleeding and stopped lipitor due to leg pain and low LDL at 45 in 11/2015. He lost 70lbs and no more snoring so he cancelled his sleep study. Had right neck mass removal in 05/2014 and pathology consistent with cyst.   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  LDL still in good control as per pt.  Patient has been consistent eating schedule and never drinks water according to him.   PLAN: Stressed the importance of management of risk factors to prevent further stroke Continue aspirin for secondary stroke prevention Maintain strict control of hypertension with blood pressure goal below 130/90, today's reading 140/80 continue antihypertensive medications Control of diabetes with hemoglobin A1c below 6.5 followed by primary care Cholesterol with LDL cholesterol less than 70, followed by primary care,  Exercise by walking,   eat healthy diet with whole grains,  fresh fruits and vegetables Follow-up with primary care for stroke risk factor modification, maintain blood pressure goal less than 793 systolic, diabetes with J0Z below 7, lipids with LDL below 70 Given information on dehydration need to increase water intake Will discharge from stroke clinic I spent 25 min  in total face to face time with the patient more than 50% of which was spent counseling and  coordination of care, reviewing test results reviewing medications and discussing and reviewing the diagnosis of stroke and management of risk factors.  He was also written handout on dehydration and the importance of water intake Dennie Bible, [facility], [facility], APRN  [facility] [address], [place], [place] [address] [phone]

## 2017-08-07 ENCOUNTER — Ambulatory Visit: Payer: 59 | Admitting: Nurse Practitioner

## 2017-08-07 ENCOUNTER — Encounter: Payer: Self-pay | Admitting: Nurse Practitioner

## 2017-08-07 VITALS — BP 140/80 | HR 52 | Ht 71.0 in | Wt 225.8 lb

## 2017-08-07 DIAGNOSIS — I610 Nontraumatic intracerebral hemorrhage in hemisphere, subcortical: Secondary | ICD-10-CM | POA: Diagnosis not present

## 2017-08-07 DIAGNOSIS — I10 Essential (primary) hypertension: Secondary | ICD-10-CM

## 2017-08-07 NOTE — Patient Instructions (Addendum)
Stressed the importance of management of risk factors to prevent further stroke Continue aspirin for secondary stroke prevention Maintain strict control of hypertension with blood pressure goal below 130/90, today's reading 140/80 continue antihypertensive medications Control of diabetes with hemoglobin A1c below 6.5 followed by primary care Cholesterol with LDL cholesterol less than 70, followed by primary care,  Exercise by walking,   eat healthy diet with whole grains,  fresh fruits and vegetables Follow-up with primary care for stroke risk factor modification, maintain blood pressure goal less than 235 systolic, diabetes with T7D below 7, lipids with LDL below 70 Given information on dehydration Will discharge from stroke clinic  Stroke Prevention Some health problems and behaviors may make it more likely for you to have a stroke. Below are ways to lessen your risk of having a stroke.  Be active for at least 30 minutes on most or all days.  Do not smoke. Try not to be around others who smoke.  Do not drink too much alcohol. ? Do not have more than 2 drinks a day if you are a man. ? Do not have more than 1 drink a day if you are a woman and are not pregnant.  Eat healthy foods, such as fruits and vegetables. If you were put on a specific diet, follow the diet as told.  Keep your cholesterol levels under control through diet and medicines. Look for foods that are low in saturated fat, trans fat, cholesterol, and are high in fiber.  If you have diabetes, follow all diet plans and take your medicine as told.  Ask your doctor if you need treatment to lower your blood pressure. If you have high blood pressure (hypertension), follow all diet plans and take your medicine as told by your doctor.  If you are 76-36 years old, have your blood pressure checked every 3-5 years. If you are age 5 or older, have your blood pressure checked every year.  Keep a healthy weight. Eat foods that are  low in calories, salt, saturated fat, trans fat, and cholesterol.  Do not take drugs.  Avoid birth control pills, if this applies. Talk to your doctor about the risks of taking birth control pills.  Talk to your doctor if you have sleep problems (sleep apnea).  Take all medicine as told by your doctor. ? You may be told to take aspirin or blood thinner medicine. Take this medicine as told by your doctor. ? Understand your medicine instructions.  Make sure any other conditions you have are being taken care of.  Get help right away if:  You suddenly lose feeling (you feel numb) or have weakness in your face, arm, or leg.  Your face or eyelid hangs down to one side.  You suddenly feel confused.  You have trouble talking (aphasia) or understanding what people are saying.  You suddenly have trouble seeing in one or both eyes.  You suddenly have trouble walking.  You are dizzy.  You lose your balance or your movements are clumsy (uncoordinated).  You suddenly have a very bad headache and you do not know the cause.  You have new chest pain.  Your heart feels like it is fluttering or skipping a beat (irregular heartbeat). Do not wait to see if the symptoms above go away. Get help right away. Call your local emergency services (911 in [place] Do not drive yourself to the hospital. This information is not intended to replace advice given to you by your health care  provider. Make sure you discuss any questions you have with your health care provider. Document Released: 09/13/2011 Document Revised: 08/20/2015 Document Reviewed: 09/14/2012 Elsevier Interactive Patient Education  2018 [employer].  Dehydration, Adult Dehydration is when there is not enough fluid or water in your body. This happens when you lose more fluids than you take in. Dehydration can range from mild to very bad. It should be treated right away to keep it from getting very bad. Symptoms of mild dehydration may  include:  Thirst.  Dry lips.  Slightly dry mouth.  Dry, warm skin.  Dizziness. Symptoms of moderate dehydration may include:  Very dry mouth.  Muscle cramps.  Dark pee (urine). Pee may be the color of tea.  Your body making less pee.  Your eyes making fewer tears.  Heartbeat that is uneven or faster than normal (palpitations).  Headache.  Light-headedness, especially when you stand up from sitting.  Fainting (syncope). Symptoms of very bad dehydration may include:  Changes in skin, such as: ? Cold and clammy skin. ? Blotchy (mottled) or pale skin. ? Skin that does not quickly return to normal after being lightly pinched and let go (poor skin turgor).  Changes in body fluids, such as: ? Feeling very thirsty. ? Your eyes making fewer tears. ? Not sweating when body temperature is high, such as in hot weather. ? Your body making very little pee.  Changes in vital signs, such as: ? Weak pulse. ? Pulse that is more than 100 beats a minute when you are sitting still. ? Fast breathing. ? Low blood pressure.  Other changes, such as: ? Sunken eyes. ? Cold hands and feet. ? Confusion. ? Lack of energy (lethargy). ? Trouble waking up from sleep. ? Short-term weight loss. ? Unconsciousness. Follow these instructions at home:  If told by your doctor, drink an ORS: ? Make an ORS by using instructions on the package. ? Start by drinking small amounts, about  cup (120 mL) every 5-10 minutes. ? Slowly drink more until you have had the amount that your doctor said to have.  Drink enough clear fluid to keep your pee clear or pale yellow. If you were told to drink an ORS, finish the ORS first, then start slowly drinking clear fluids. Drink fluids such as: ? Water. Do not drink only water by itself. Doing that can make the salt (sodium) level in your body get too low (hyponatremia). ? Ice chips. ? Fruit juice that you have added water to (diluted). ? Low-calorie sports  drinks.  Avoid: ? Alcohol. ? Drinks that have a lot of sugar. These include high-calorie sports drinks, fruit juice that does not have water added, and soda. ? Caffeine. ? Foods that are greasy or have a lot of fat or sugar.  Take over-the-counter and prescription medicines only as told by your doctor.  Do not take salt tablets. Doing that can make the salt level in your body get too high (hypernatremia).  Eat foods that have minerals (electrolytes). Examples include bananas, oranges, potatoes, tomatoes, and spinach.  Keep all follow-up visits as told by your doctor. This is important. Contact a doctor if:  You have belly (abdominal) pain that: ? Gets worse. ? Stays in one area (localizes).  You have a rash.  You have a stiff neck.  You get angry or annoyed more easily than normal (irritability).  You are more sleepy than normal.  You have a harder time waking up than normal.  You feel: ?  Weak. ? Dizzy. ? Very thirsty.  You have peed (urinated) only a small amount of very dark pee during 6-8 hours. Get help right away if:  You have symptoms of very bad dehydration.  You cannot drink fluids without throwing up (vomiting).  Your symptoms get worse with treatment.  You have a fever.  You have a very bad headache.  You are throwing up or having watery poop (diarrhea) and it: ? Gets worse. ? Does not go away.  You have blood or something green (bile) in your throw-up.  You have blood in your poop (stool). This may cause poop to look black and tarry.  You have not peed in 6-8 hours.  You pass out (faint).  Your heart rate when you are sitting still is more than 100 beats a minute.  You have trouble breathing. This information is not intended to replace advice given to you by your health care provider. Make sure you discuss any questions you have with your health care provider. Document Released: 01/08/2009 Document Revised: 10/02/2015 Document Reviewed:  05/08/2015 Elsevier Interactive Patient Education  2018 [employer].

## 2017-08-17 DIAGNOSIS — C44519 Basal cell carcinoma of skin of other part of trunk: Secondary | ICD-10-CM | POA: Diagnosis not present

## 2017-09-29 DIAGNOSIS — Z111 Encounter for screening for respiratory tuberculosis: Secondary | ICD-10-CM | POA: Diagnosis not present

## 2017-10-12 DIAGNOSIS — Z6831 Body mass index (BMI) 31.0-31.9, adult: Secondary | ICD-10-CM | POA: Diagnosis not present

## 2017-10-12 DIAGNOSIS — S80862A Insect bite (nonvenomous), left lower leg, initial encounter: Secondary | ICD-10-CM | POA: Diagnosis not present

## 2017-10-12 DIAGNOSIS — L03116 Cellulitis of left lower limb: Secondary | ICD-10-CM | POA: Diagnosis not present

## 2017-12-14 DIAGNOSIS — M25621 Stiffness of right elbow, not elsewhere classified: Secondary | ICD-10-CM | POA: Diagnosis not present

## 2017-12-14 DIAGNOSIS — M25421 Effusion, right elbow: Secondary | ICD-10-CM | POA: Diagnosis not present

## 2017-12-14 DIAGNOSIS — M25521 Pain in right elbow: Secondary | ICD-10-CM | POA: Diagnosis not present

## 2017-12-21 DIAGNOSIS — M25521 Pain in right elbow: Secondary | ICD-10-CM | POA: Diagnosis not present

## 2017-12-21 DIAGNOSIS — M25621 Stiffness of right elbow, not elsewhere classified: Secondary | ICD-10-CM | POA: Diagnosis not present

## 2017-12-21 DIAGNOSIS — M25421 Effusion, right elbow: Secondary | ICD-10-CM | POA: Diagnosis not present

## 2017-12-24 DIAGNOSIS — R1032 Left lower quadrant pain: Secondary | ICD-10-CM | POA: Diagnosis not present

## 2017-12-24 DIAGNOSIS — K5792 Diverticulitis of intestine, part unspecified, without perforation or abscess without bleeding: Secondary | ICD-10-CM | POA: Diagnosis not present

## 2017-12-28 DIAGNOSIS — M25621 Stiffness of right elbow, not elsewhere classified: Secondary | ICD-10-CM | POA: Diagnosis not present

## 2017-12-28 DIAGNOSIS — M25521 Pain in right elbow: Secondary | ICD-10-CM | POA: Diagnosis not present

## 2017-12-28 DIAGNOSIS — M25421 Effusion, right elbow: Secondary | ICD-10-CM | POA: Diagnosis not present

## 2018-01-09 DIAGNOSIS — I10 Essential (primary) hypertension: Secondary | ICD-10-CM | POA: Diagnosis not present

## 2018-01-09 DIAGNOSIS — E785 Hyperlipidemia, unspecified: Secondary | ICD-10-CM | POA: Diagnosis not present

## 2018-01-09 DIAGNOSIS — Z23 Encounter for immunization: Secondary | ICD-10-CM | POA: Diagnosis not present

## 2018-01-09 DIAGNOSIS — I619 Nontraumatic intracerebral hemorrhage, unspecified: Secondary | ICD-10-CM | POA: Diagnosis not present

## 2018-01-19 DIAGNOSIS — S60561A Insect bite (nonvenomous) of right hand, initial encounter: Secondary | ICD-10-CM | POA: Diagnosis not present

## 2018-02-08 DIAGNOSIS — J019 Acute sinusitis, unspecified: Secondary | ICD-10-CM | POA: Diagnosis not present

## 2018-02-21 DIAGNOSIS — J00 Acute nasopharyngitis [common cold]: Secondary | ICD-10-CM | POA: Diagnosis not present

## 2018-02-21 DIAGNOSIS — Z6831 Body mass index (BMI) 31.0-31.9, adult: Secondary | ICD-10-CM | POA: Diagnosis not present

## 2018-03-30 DIAGNOSIS — R2689 Other abnormalities of gait and mobility: Secondary | ICD-10-CM | POA: Diagnosis not present

## 2018-03-30 DIAGNOSIS — M25562 Pain in left knee: Secondary | ICD-10-CM | POA: Diagnosis not present

## 2018-04-04 DIAGNOSIS — R42 Dizziness and giddiness: Secondary | ICD-10-CM | POA: Diagnosis not present

## 2018-04-04 DIAGNOSIS — Z6831 Body mass index (BMI) 31.0-31.9, adult: Secondary | ICD-10-CM | POA: Diagnosis not present

## 2018-04-04 DIAGNOSIS — I951 Orthostatic hypotension: Secondary | ICD-10-CM | POA: Diagnosis not present

## 2018-04-05 IMAGING — CT CT ANGIO CHEST-ABD-PELV FOR DISSECTION W/ AND WO/W CM
2 of 7 series · 14 of 46 positions shown, 16 images · IV contrast (OMNI 350)
Comparison: Neck CTA 07/19/2015

CLINICAL DATA: Elevated blood pressure in the emergency department
yesterday. No history of hypertension. Evaluate for aortic
dissection.

EXAM:
CT ANGIOGRAPHY CHEST, ABDOMEN AND PELVIS
TECHNIQUE: Multidetector CT imaging through the chest, abdomen and pelvis was
performed using the standard protocol during bolus administration of
intravenous contrast. Multiplanar reconstructed images and MIPs were
obtained and reviewed to evaluate the vascular anatomy.
CONTRAST:  100 ml Isovue 370

[Series 7: dissection 2mm · axial · 0.84mm/px · z∈[+1006,+1602]mm · 11 of 336 slices shown, 13 images]
[im 19/336  soft-tissue]
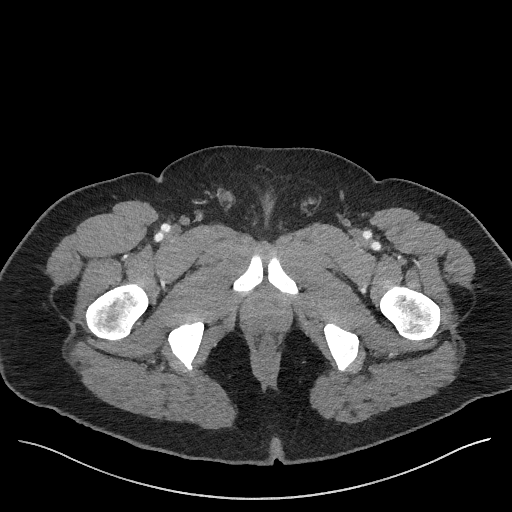
[im 19/336  bone]
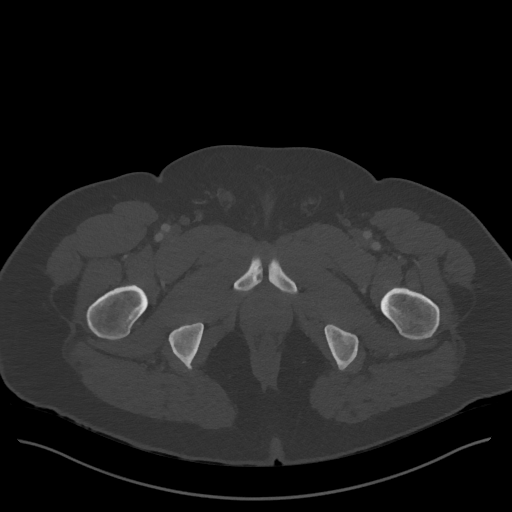
[im 56/336  soft-tissue]
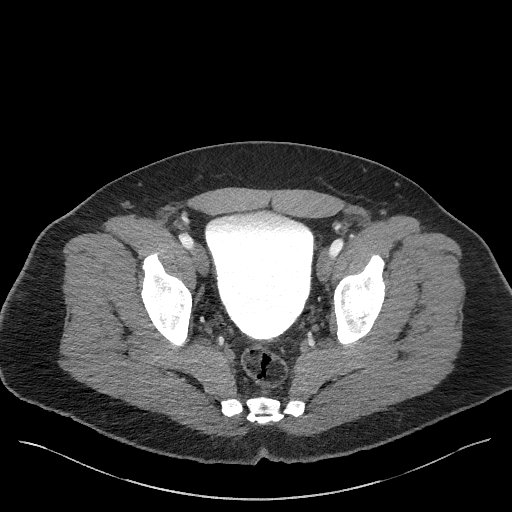
[im 75/336  soft-tissue]
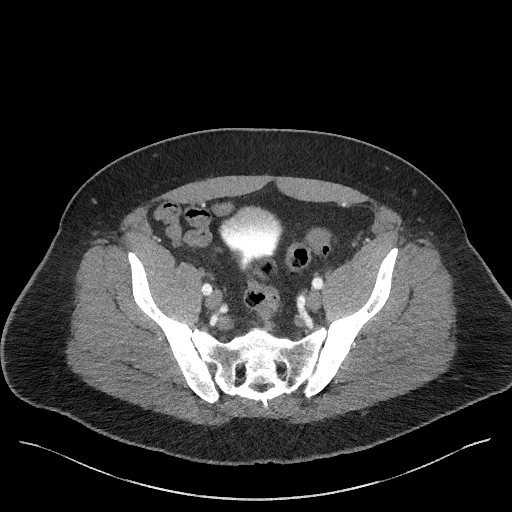
[im 112/336  soft-tissue]
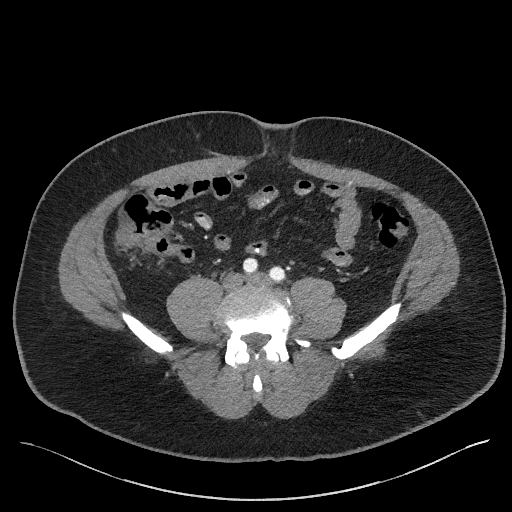
[im 131/336  soft-tissue]
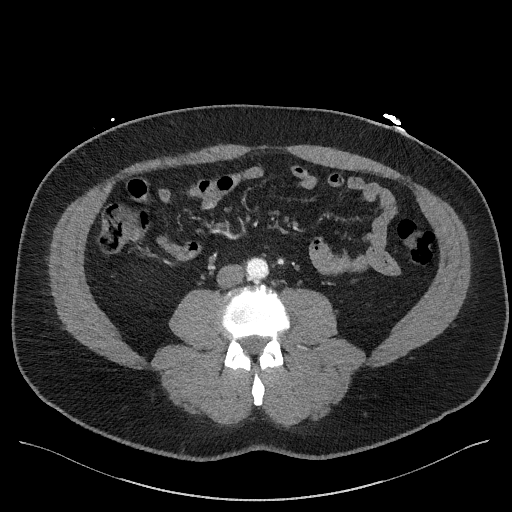
[im 168/336  soft-tissue]
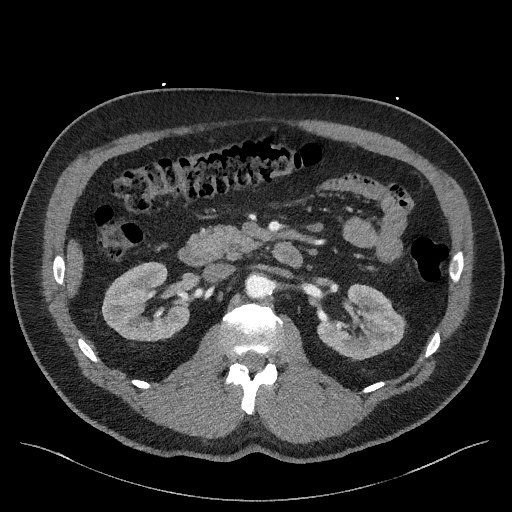
[im 205/336  soft-tissue]
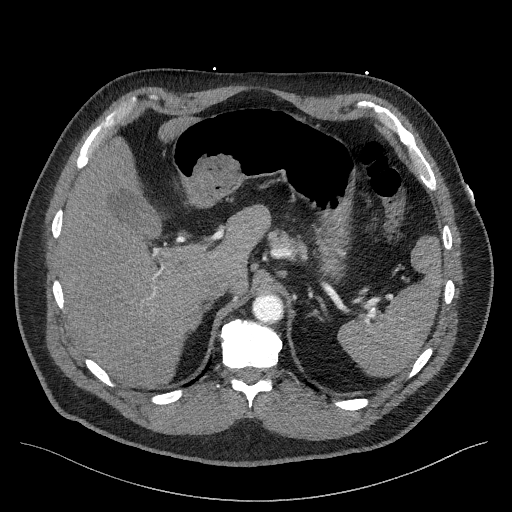
[im 224/336  soft-tissue]
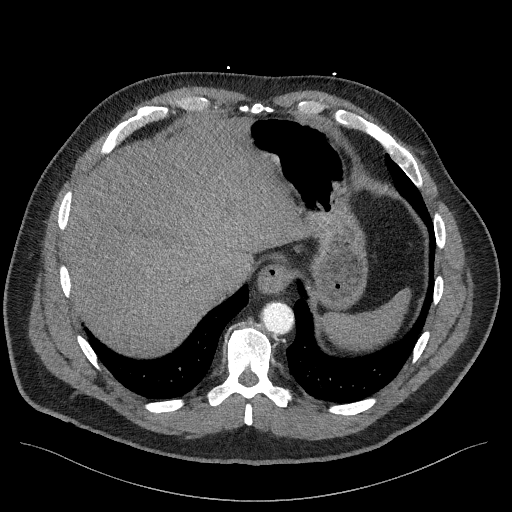
[im 261/336  soft-tissue]
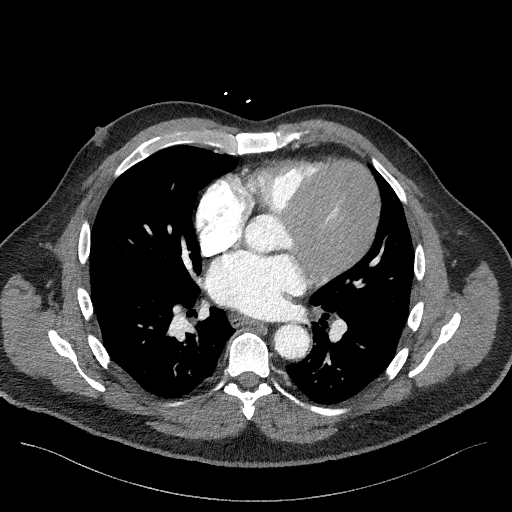
[im 261/336  bone]
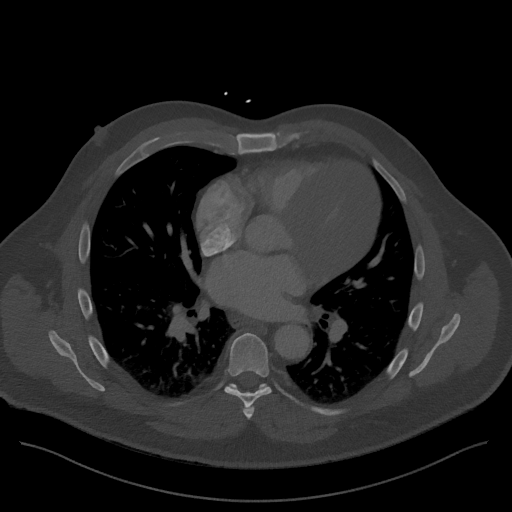
[im 280/336  soft-tissue]
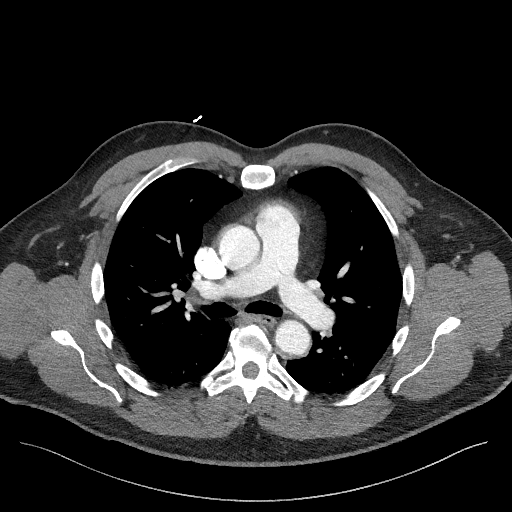
[im 317/336  soft-tissue]
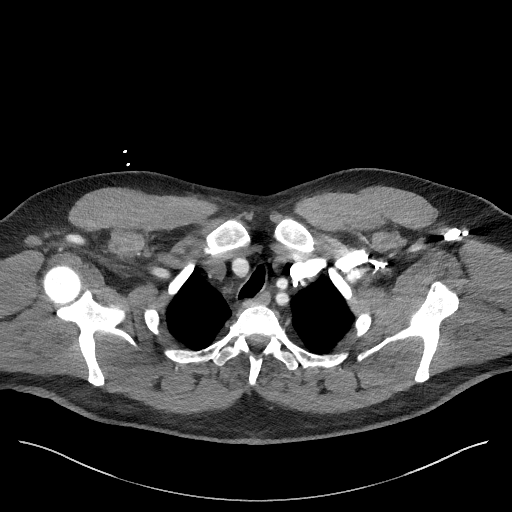

[Series 10: dissection 2mm cor · coronal · 0.83mm/px · 3 of 160 slices shown]
[im 40/160  soft-tissue]
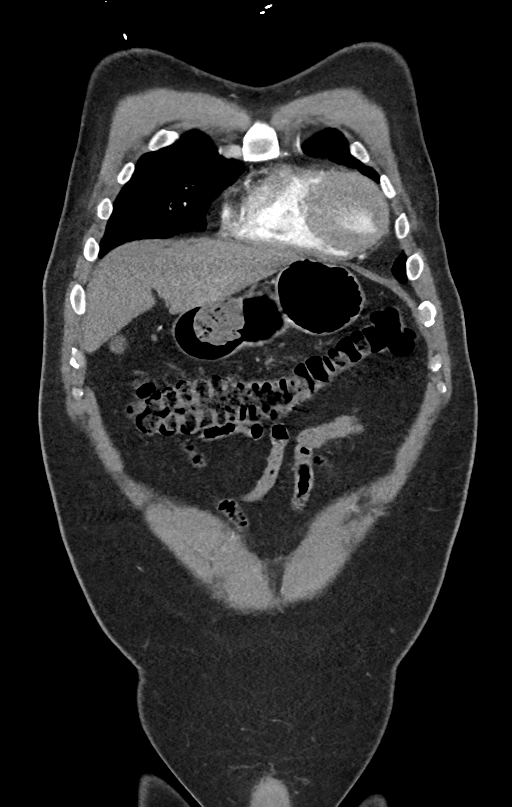
[im 80/160  soft-tissue]
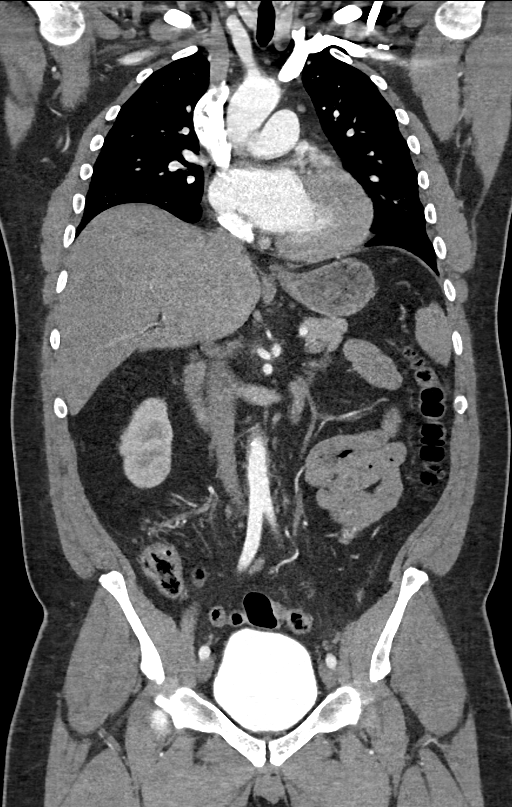
[im 120/160  soft-tissue]
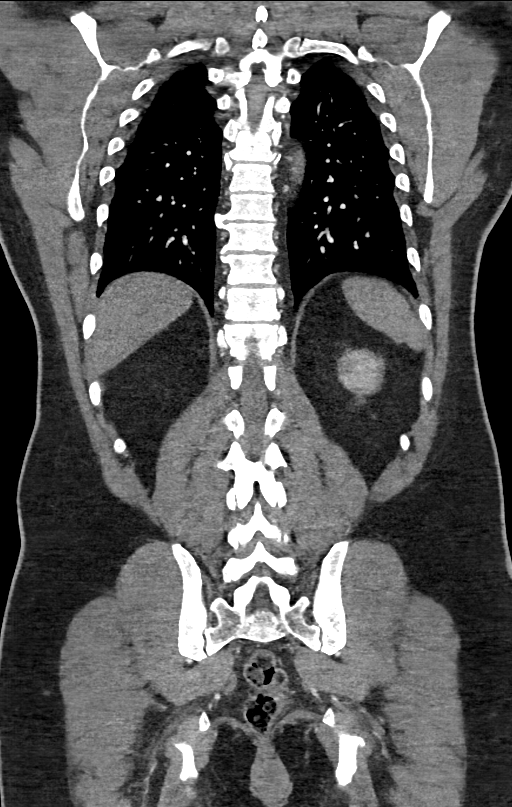

[14 of 46 positions shown; findings below may reference images not displayed]

FINDINGS: CTA CHEST FINDINGS

Mediastinum: The pulmonary arteries are well opacified with
contrast. There is no evidence of acute pulmonary embolism. Pre
contrast images demonstrate no displaced intimal calcifications
within the aorta or great vessels. Post-contrast, the aorta enhances
normally. There is no evidence of dissection or aneurysm. The heart
size is normal. There is no pericardial effusion. There is probable
left ventricular hypertrophy. There are no enlarged mediastinal,
hilar or axillary lymph nodes. Small hiatal hernia.

Lungs/Pleura: There is no pleural effusion.The lungs are clear on
the noncontrast images. There is mild dependent atelectasis on the
contrasted images.

Musculoskeletal/Chest wall: No chest wall lesion or acute osseous
findings.

Review of the MIP images confirms the above findings.

CTA ABDOMEN AND PELVIS FINDINGS

Hepatobiliary: The hepatic density is decreased consistent with
steatosis. No focal lesions are identified post-contrast. No
evidence of gallstones, gallbladder wall thickening or biliary
dilatation.

Pancreas: Unremarkable. No pancreatic ductal dilatation or
surrounding inflammatory changes.

Spleen: Normal in size without focal abnormality.

Adrenals/Urinary Tract: Both adrenal glands appear normal.
Nonobstructing calculi are present in the upper and lower poles of
the left kidney. There are left renal cysts measuring up to 3.2 cm
in the upper pole. There is a small exophytic lesion in the lower
pole the left kidney measuring 8 mm on image 185 and in
indeterminate density of 45 HU. This is not imaged prior to contrast
administration. The right kidney appears normal. The bladder appears
normal.

Stomach/Bowel: No evidence of bowel wall thickening, distention or
surrounding inflammatory change. There are moderate diverticular
changes within the descending and sigmoid colon. The appendix
appears normal.

Vascular/Lymphatic: There are no enlarged abdominal or pelvic lymph
nodes. Mild aortoiliac atherosclerosis. No evidence of dissection,
aneurysm or retroperitoneal hemorrhage. No evidence of large vessel
occlusion.

Reproductive: The prostate gland and seminal vesicles appear
unremarkable.

Other: Small umbilical hernia containing only fat.

Musculoskeletal: No acute or significant osseous findings. Mild
lower lumbar spondylosis.

Review of the MIP images confirms the above findings.
IMPRESSION: 1. No acute vascular findings in the chest and abdomen or pelvis. No
evidence of aortic dissection or aneurysm. Minimal atherosclerosis
noted.
2. Probable left ventricular hypertrophy.
3. Left renal cysts, including a small deform it lesion in the lower
pole the left kidney.
4. Hepatic steatosis and small hiatal hernia.

## 2018-05-02 DIAGNOSIS — L300 Nummular dermatitis: Secondary | ICD-10-CM | POA: Diagnosis not present

## 2018-05-15 DIAGNOSIS — M25612 Stiffness of left shoulder, not elsewhere classified: Secondary | ICD-10-CM | POA: Diagnosis not present

## 2018-05-15 DIAGNOSIS — M25512 Pain in left shoulder: Secondary | ICD-10-CM | POA: Diagnosis not present

## 2018-05-24 DIAGNOSIS — M25612 Stiffness of left shoulder, not elsewhere classified: Secondary | ICD-10-CM | POA: Diagnosis not present

## 2018-05-24 DIAGNOSIS — M25512 Pain in left shoulder: Secondary | ICD-10-CM | POA: Diagnosis not present
